# Patient Record
Sex: Male | Born: 1965 | Race: Black or African American | Hispanic: No | Marital: Married | Smoking: Former smoker
Health system: Southern US, Community
[De-identification: ages and names within clinical notes are randomized; demographics above are authoritative.]

## PROBLEM LIST (undated history)

## (undated) DIAGNOSIS — F329 Major depressive disorder, single episode, unspecified: Secondary | ICD-10-CM

## (undated) DIAGNOSIS — F1910 Other psychoactive substance abuse, uncomplicated: Secondary | ICD-10-CM

## (undated) DIAGNOSIS — E559 Vitamin D deficiency, unspecified: Secondary | ICD-10-CM

## (undated) DIAGNOSIS — K219 Gastro-esophageal reflux disease without esophagitis: Secondary | ICD-10-CM

## (undated) DIAGNOSIS — T7840XA Allergy, unspecified, initial encounter: Secondary | ICD-10-CM

## (undated) DIAGNOSIS — F32A Depression, unspecified: Secondary | ICD-10-CM

## (undated) DIAGNOSIS — E7800 Pure hypercholesterolemia, unspecified: Secondary | ICD-10-CM

## (undated) HISTORY — DX: Major depressive disorder, single episode, unspecified: F32.9

## (undated) HISTORY — PX: VASECTOMY: SHX75

## (undated) HISTORY — DX: Depression, unspecified: F32.A

## (undated) HISTORY — DX: Other psychoactive substance abuse, uncomplicated: F19.10

## (undated) HISTORY — DX: Gastro-esophageal reflux disease without esophagitis: K21.9

## (undated) HISTORY — DX: Allergy, unspecified, initial encounter: T78.40XA

## (undated) HISTORY — PX: WRIST DEBRIDEMENT: SHX2671

---

## 1999-09-17 ENCOUNTER — Other Ambulatory Visit (HOSPITAL_COMMUNITY): Admission: RE | Admit: 1999-09-17 | Discharge: 1999-10-01 | Payer: Self-pay | Admitting: Psychiatry

## 2002-04-29 ENCOUNTER — Emergency Department (HOSPITAL_COMMUNITY): Admission: EM | Admit: 2002-04-29 | Discharge: 2002-04-29 | Payer: Self-pay | Admitting: Emergency Medicine

## 2007-11-30 ENCOUNTER — Emergency Department (HOSPITAL_BASED_OUTPATIENT_CLINIC_OR_DEPARTMENT_OTHER): Admission: EM | Admit: 2007-11-30 | Discharge: 2007-11-30 | Payer: Self-pay | Admitting: Emergency Medicine

## 2010-11-26 LAB — POCT CARDIAC MARKERS
CKMB, poc: <1 — ABNORMAL LOW
Myoglobin, poc: 43.4

## 2010-11-26 LAB — DIFFERENTIAL
Basophils Absolute: 0
Eosinophils Relative: 2
Lymphocytes Relative: 44
Monocytes Absolute: 0.4
Monocytes Relative: 5
Neutrophils Relative %: 48

## 2010-11-26 LAB — HEPATIC FUNCTION PANEL
ALT: 16
AST: 22
Albumin: 4.2
Bilirubin, Direct: 0
Indirect Bilirubin: 0.3
Total Bilirubin: 0.3
Total Protein: 7.3

## 2010-11-26 LAB — CBC
HCT: 44.8
MCV: 86.7
WBC: 6.7

## 2010-11-26 LAB — BASIC METABOLIC PANEL
BUN: 8
Calcium: 9.2
GFR calc Af Amer: >60
Glucose, Bld: 111 — ABNORMAL HIGH
Sodium: 141

## 2011-04-17 ENCOUNTER — Emergency Department (HOSPITAL_COMMUNITY)
Admission: EM | Admit: 2011-04-17 | Discharge: 2011-04-17 | Disposition: A | Discharge status: Nursing Home (Includes ICF) | Payer: Self-pay | Source: Home / Self Care | Attending: Emergency Medicine | Admitting: Emergency Medicine

## 2011-04-17 ENCOUNTER — Other Ambulatory Visit: Payer: Self-pay

## 2011-04-17 ENCOUNTER — Emergency Department (HOSPITAL_COMMUNITY): Payer: Self-pay

## 2011-04-17 ENCOUNTER — Encounter (HOSPITAL_COMMUNITY): Payer: Self-pay | Admitting: *Deleted

## 2011-04-17 ENCOUNTER — Emergency Department (HOSPITAL_COMMUNITY)
Admission: EM | Admit: 2011-04-17 | Discharge: 2011-04-18 | Disposition: A | Discharge status: Home / Self Care | Payer: Self-pay | Attending: Emergency Medicine | Admitting: Emergency Medicine

## 2011-04-17 DIAGNOSIS — K219 Gastro-esophageal reflux disease without esophagitis: Secondary | ICD-10-CM | POA: Insufficient documentation

## 2011-04-17 DIAGNOSIS — R079 Chest pain, unspecified: Secondary | ICD-10-CM

## 2011-04-17 DIAGNOSIS — R0789 Other chest pain: Secondary | ICD-10-CM | POA: Insufficient documentation

## 2011-04-17 DIAGNOSIS — Z87891 Personal history of nicotine dependence: Secondary | ICD-10-CM | POA: Insufficient documentation

## 2011-04-17 LAB — CBC
Hemoglobin: 16.3 g/dL (ref 13.0–17.0)
MCH: 30.4 pg (ref 26.0–34.0)
MCHC: 35.9 g/dL (ref 30.0–36.0)
MCV: 84.5 fL (ref 78.0–100.0)
Platelets: 155 10*3/uL (ref 150–400)
RBC: 5.37 MIL/uL (ref 4.22–5.81)
WBC: 5.9 10*3/uL (ref 4.0–10.5)

## 2011-04-17 LAB — GLUCOSE, CAPILLARY: Glucose-Capillary: 91 mg/dL (ref 70–99)

## 2011-04-17 LAB — PRO B NATRIURETIC PEPTIDE: Pro B Natriuretic peptide (BNP): 14.1 pg/mL (ref 0–125)

## 2011-04-17 LAB — MAGNESIUM: Magnesium: 2 mg/dL (ref 1.5–2.5)

## 2011-04-17 LAB — BASIC METABOLIC PANEL: BUN: 8 mg/dL (ref 6–23)

## 2011-04-17 MED ORDER — GI COCKTAIL ~~LOC~~
30.0000 mL | Freq: Once | ORAL | Status: AC
  Administered 2011-04-17: 30 mL via ORAL
  Filled 2011-04-17: qty 30

## 2011-04-17 MED ORDER — ASPIRIN 81 MG PO CHEW
CHEWABLE_TABLET | ORAL | Status: AC
  Filled 2011-04-17: qty 1

## 2011-04-17 MED ORDER — PANTOPRAZOLE SODIUM 40 MG PO TBEC
40.0000 mg | DELAYED_RELEASE_TABLET | Freq: Once | ORAL | Status: AC
  Administered 2011-04-17: 40 mg via ORAL
  Filled 2011-04-17: qty 1

## 2011-04-17 MED ORDER — ASPIRIN 325 MG PO TABS: 325.0000 mg | ORAL_TABLET | ORAL | Status: DC

## 2011-04-17 MED ORDER — NITROGLYCERIN 0.4 MG SL SUBL
0.4000 mg | SUBLINGUAL_TABLET | SUBLINGUAL | Status: DC | PRN
  Filled 2011-04-17 (×2): qty 25

## 2011-04-17 MED ORDER — PANTOPRAZOLE SODIUM 40 MG PO TBEC
40.0000 mg | DELAYED_RELEASE_TABLET | Freq: Once | ORAL | Status: AC
  Administered 2011-04-18: 40 mg via ORAL

## 2011-04-17 MED ORDER — ASPIRIN 81 MG PO CHEW
81.0000 mg | CHEWABLE_TABLET | Freq: Once | ORAL | Status: AC
  Administered 2011-04-17: 81 mg via ORAL

## 2011-04-17 MED ORDER — ASPIRIN EC 325 MG PO TBEC
DELAYED_RELEASE_TABLET | ORAL | Status: AC
  Administered 2011-04-17: 325 mg
  Filled 2011-04-17: qty 1

## 2011-04-17 NOTE — ED Provider Notes (Signed)
History     CSN: [id number]  Arrival date & time 04/17/11  1908   First MD Initiated Contact with Patient 04/17/11 2103      Chief Complaint  Patient presents with  . Chest Pain    (Consider location/radiation/quality/duration/timing/severity/associated sxs/prior treatment) HPI  Daniel Lloyd is a 46 y.o. male who has been having intermittent pain for 60 minutes 4-5 times a day at various times a day without known provocation. The pain is a tight feeling in his left lower chest. He does not have associated nausea, vomiting, diarrhea, diaphoresis, or back pain. His legs occasionally feel like they are weak as if he had been running on a treadmill. He has tried gas pills, but no other medications. He had a similar problem to this about a year ago, and was treated with Prilosec for 2 weeks. He has no coronary risk factors.        History reviewed. No pertinent past medical history.  Past Surgical History  Procedure Date  . Wrist debridement     Family History  Problem Relation Age of Onset  . Hypertension Mother   . Cancer Other   . Diabetes Other     History  Substance Use Topics  . Smoking status: Former Games developer  . Smokeless tobacco: Not on file  . Alcohol Use: No      Review of Systems  All other systems reviewed and are negative.    Allergies  Review of patient's allergies indicates no known allergies.  Home Medications  No current outpatient prescriptions on file.  BP 132/92  Pulse 61  Temp(Src) 98.4 F (36.9 C) (Oral)  Resp 16  Ht 5\' 9"  (1.753 m)  Wt 220 lb (99.791 kg)  BMI 32.49 kg/m2  SpO2 100%  Physical Exam  Nursing note and vitals reviewed. Constitutional: He is oriented to person, place, and time. He appears well-developed and well-nourished. No distress.  HENT:  Head: Normocephalic and atraumatic.  Right Ear: External ear normal.  Left Ear: External ear normal.  Eyes: Conjunctivae and EOM are normal. Pupils are equal, round, and  reactive to light.  Neck: Normal range of motion and phonation normal. Neck supple.  Cardiovascular: Normal rate, regular rhythm, normal heart sounds and intact distal pulses.   Pulmonary/Chest: Effort normal and breath sounds normal. He exhibits no bony tenderness.       Mild left lower chest wall tenderness  Abdominal: Soft. Normal appearance. There is no tenderness.  Musculoskeletal: Normal range of motion.  Neurological: He is alert and oriented to person, place, and time. He has normal strength. No cranial nerve deficit or sensory deficit. He exhibits normal muscle tone. Coordination normal.  Skin: Skin is warm, dry and intact.  Psychiatric: He has a normal mood and affect. His behavior is normal. Judgment and thought content normal.    ED Course  Procedures (including critical care time)  Date: 04/17/2011  Rate: 58  Rhythm: normal sinus rhythm  QRS Axis: normal  Intervals: normal  ST/T Wave abnormalities: nonspecific ST changes  Conduction Disutrbances:none  Narrative Interpretation:   Old EKG Reviewed: unchanged    Labs Reviewed  BASIC METABOLIC PANEL - Abnormal; Notable for the following:    GFR calc non Af Amer 66 (*)    GFR calc Af Amer 76 (*)    All other components within normal limits  CBC  PRO B NATRIURETIC PEPTIDE  TROPONIN I  MAGNESIUM   Dg Chest 2 View  04/17/2011  *RADIOLOGY REPORT*  Clinical Data: Chest pain and chest tightness  CHEST - 2 VIEW  Comparison: 11/30/2007  Findings: Heart is normal in size.  Linear opacities at both lung bases likely represent bibasilar atelectasis.  Bronchitic changes. No pneumothorax.  No pleural effusion.  IMPRESSION: Bibasilar atelectasis.  Original Report Authenticated By: Donavan Burnet, M.D.   Emergency department treatment: Tonics and GI cocktail  1. GERD (gastroesophageal reflux disease)       MDM  Nonspecific chest tightness. Evaluation negative for cardiac disease. Doubt ACS, pneumonia, PE. He may have GERD.  Patient stable for discharge with symptomatic treatment.        Flint Melter, MD 04/18/11 0000

## 2011-04-17 NOTE — ED Notes (Signed)
Pt has had "bloating" and CP for over a week which has been associated with sob.  Pt was seen at [facility] and sent here for further eval

## 2011-04-17 NOTE — ED Notes (Signed)
Report called to Selena Batten RN triage nurse [place] and pt transferred to [facility] ED via [facility] shuttle.  Pt states still no pain, just same chest tightness and feeling bloated

## 2011-04-17 NOTE — ED Notes (Signed)
Monitor on pt. Shows NSR rate 67

## 2011-04-17 NOTE — ED Notes (Signed)
C/o chest tightness, radiating to both armpits mostly left and to mostly left side of neck.  Also c/o feeling bloated and feeling like when he swallows it doesn't do down.  Also c/o tightness "around to my back."  States this has been going on "for years"  Also states he now has blurry vision and fatigue w/ this.  States if he stands up for a long time it's "like I have been working out for a long time"  States he has been to [facility] for same c/o in the past

## 2011-04-17 NOTE — ED Provider Notes (Signed)
History     CSN: [id number]  Arrival date & time 04/17/11  1710   First MD Initiated Contact with Patient 04/17/11 1816      Chief Complaint  Patient presents with  . Chest Pain    (Consider location/radiation/quality/duration/timing/severity/associated sxs/prior treatment) HPI Comments: Patient presents to urgent care tonight complaining of ongoing chest tightness and discomfort radiating to both of his armpits and neck. With the same importance he has been feeling that his abdomen feels bloated and some discomfort on his back as well has been: On for months..  Pain has not changed in character or intensity for months, decided to come in as he felt more tired than usual and also noticed that his vision is blurred in both eyes since this morning.  Patient describes that he has not seen a doctor since his last visit to the emergency department. And also describes that he doesn't think he has a heart problem feels more like a stomach problem.   History reviewed. No pertinent past medical history.  Past Surgical History  Procedure Date  . Wrist debridement     Family History  Problem Relation Age of Onset  . Hypertension Mother   . Cancer Other   . Diabetes Other     History  Substance Use Topics  . Smoking status: Former Games developer  . Smokeless tobacco: Not on file  . Alcohol Use: No      Review of Systems  Constitutional: Positive for activity change, appetite change and fatigue. Negative for fever, chills and diaphoresis.  Respiratory: Negative for cough, chest tightness and shortness of breath.   Cardiovascular: Positive for chest pain. Negative for palpitations and leg swelling.  Gastrointestinal: Negative for nausea, vomiting, abdominal pain, diarrhea and anal bleeding.  Skin: Negative for color change, pallor and rash.  Neurological: Negative for tremors, syncope, speech difficulty, weakness, numbness and headaches.    Allergies  Review of patient's allergies  indicates no known allergies.  Home Medications  No current outpatient prescriptions on file.  BP 128/85  Pulse 71  Temp(Src) 98 F (36.7 C) (Oral)  Resp 17  SpO2 100%  Physical Exam  Nursing note and vitals reviewed. Constitutional: He appears well-developed and well-nourished. No distress.  HENT:  Head: Normocephalic.  Mouth/Throat: No oropharyngeal exudate.  Eyes: Conjunctivae are normal. No scleral icterus.  Neck: Neck supple. No JVD present.  Cardiovascular: Normal rate, regular rhythm and normal heart sounds.   Pulmonary/Chest: Effort normal.  Abdominal: Soft. He exhibits no shifting dullness. There is no hepatosplenomegaly. There is no tenderness. There is negative Murphy's sign.  Lymphadenopathy:    He has no cervical adenopathy.  Neurological: He is alert.  Skin: Skin is warm.    ED Course  Procedures (including critical care time)  Labs Reviewed - No data to display No results found.   1. Chest pain       MDM  Patient presents to urgent care with [facility] symptomatology that includes chest tightness and discomfort with abdominal discomfort that has been going on for several months. Today he noted that he has been feeling tired and blurry vision. His physical exam was somewhat unremarkable and has no neurological deficits is normotensive looks comfortable his EKG looks comparatively the same as previous one done 2011. I will transfer patient to the emergency department to have a more comprehensive evaluation including cardiac markers. My impression at this point as the patient is not experiencing  an acute coronary syndrome.  Jimmie Molly, MD 04/17/11 [phone]

## 2011-04-17 NOTE — Discharge Instructions (Signed)
Use of omeprazole 20 mg twice a day for 2 weeks then once a day for 2 weeks.You can supplement this treatment with Maalox or Mylanta before meals and at bed time. Use a resource guide to find a primary care Dr. to see for a checkup in 2-3 weeks. Return here if needed for problems.  RESOURCE GUIDE  Dental Problems  Patients with Medicaid: [facility] [phone] [address]                                           [phone] [address] Phone:  [phone]                                                  Phone:  [phone]  If unable to pay or uninsured, contact:  [facility] or [facility]. to become qualified for the adult dental clinic.  Chronic Pain Problems Contact [facility]  [phone] Patients need to be referred by their primary care doctor.  Insufficient Money for Medicine Contact [employer]:  call "211" or [facility] 514-121-0547.  No Primary Care Doctor Call [facility]  [phone] Other agencies that provide inexpensive medical care    [facility]  [phone]    [facility]  [phone]    [facility]  [phone]    [facility]  [phone]    [facility]  [phone]    [facility]  [phone]  Psychological Services [facility] Behavioral Health  [phone] [facility] Services  [phone] [facility] Mental Health   [phone] (emergency services [phone])  Substance Abuse Resources [facility]  [phone] [facility] [phone] [facility] [place] 6261780195 Floydene Flock [phone] Residential & Outpatient Substance Abuse Program  [phone]  Abuse/Neglect [facility] Child Abuse Hotline 805-140-6724 [facility] Child Abuse Hotline 989-544-2055 (After Hours)  Emergency Shelter [facility] 2531929727  Maternity Homes [facility] [place] [facility] [phone] [facility] [phone]  MRSA Hotline #:   563 114 4935    [facility] Resources  [facility]     [employer]                          [facility] [address] [place]                       [address]      [address]                                                  Cristobal Goldmann Phone:  [phone]  Phone:  [phone]                 Phone:  [phone]  [facility] Mental Health Phone:  [phone]  [facility] Child Abuse Hotline 918-052-1692 [phone] (After Hours) Diet for GERD or PUD Nutrition therapy can help ease the discomfort of gastroesophageal reflux disease (GERD) and peptic ulcer disease (PUD).  HOME CARE INSTRUCTIONS   Eat your meals slowly, in a relaxed setting.   Eat 5 to 6 small meals per day.   If a food causes distress, stop eating it for a period of time.  FOODS TO AVOID  Coffee, regular or decaffeinated.   Cola beverages, regular or low calorie.   Tea, regular or decaffeinated.   Pepper.   Cocoa.   High fat foods, including meats.   Butter, margarine, hydrogenated oil (trans fats).   Peppermint or spearmint (if you have GERD).   Fruits and vegetables if not tolerated.   Alcohol.   Nicotine (smoking or chewing). This is one of the most potent stimulants to acid production in the gastrointestinal tract.   Any food that seems to aggravate your condition.  If you have questions regarding your diet, ask your caregiver or a registered dietitian. TIPS  Lying flat may make symptoms worse. Keep the head of your bed raised 6 to 9 inches (15 to 23 cm) by using a foam wedge or blocks under the legs of the bed.   Do not lay down until 3 hours after eating a meal.   Daily physical activity may help reduce symptoms.  MAKE SURE YOU:   Understand these instructions.   Will  watch your condition.   Will get help right away if you are not doing well or get worse.  Document Released: 02/10/2005 Document Revised: 10/23/2010 Document Reviewed: 06/26/2008 [facility] Patient Information 2012 [place], [place].Gastroesophageal Reflux Disease, Adult Gastroesophageal reflux disease (GERD) happens when acid from your stomach flows up into the esophagus. When acid comes in contact with the esophagus, the acid causes soreness (inflammation) in the esophagus. Over time, GERD may create small holes (ulcers) in the lining of the esophagus. CAUSES   Increased body weight. This puts pressure on the stomach, making acid rise from the stomach into the esophagus.   Smoking. This increases acid production in the stomach.   Drinking alcohol. This causes decreased pressure in the lower esophageal sphincter (valve or ring of muscle between the esophagus and stomach), allowing acid from the stomach into the esophagus.   Late evening meals and a full stomach. This increases pressure and acid production in the stomach.   A malformed lower esophageal sphincter.  Sometimes, no cause is found. SYMPTOMS   Burning pain in the lower part of the mid-chest behind the breastbone and in the mid-stomach area. This may occur twice a week or more often.   Trouble swallowing.   Sore throat.   Dry cough.   Asthma-like symptoms including chest tightness, shortness of breath, or wheezing.  DIAGNOSIS  Your caregiver may be able to diagnose GERD based on your symptoms. In some cases, X-rays and other tests may be done to check for complications or to check the condition of your stomach and esophagus. TREATMENT  Your caregiver may recommend over-the-counter or prescription medicines to help decrease acid production. Ask your caregiver before starting or adding any new medicines.  HOME CARE INSTRUCTIONS   Change the factors that you can control. Ask your caregiver for guidance concerning weight loss,  quitting  smoking, and alcohol consumption.   Avoid foods and drinks that make your symptoms worse, such as:   Caffeine or alcoholic drinks.   Chocolate.   Peppermint or mint flavorings.   Garlic and onions.   Spicy foods.   Citrus fruits, such as oranges, lemons, or limes.   Tomato-based foods such as sauce, chili, salsa, and pizza.   Fried and fatty foods.   Avoid lying down for the 3 hours prior to your bedtime or prior to taking a nap.   Eat small, frequent meals instead of large meals.   Wear loose-fitting clothing. Do not wear anything tight around your waist that causes pressure on your stomach.   Raise the head of your bed 6 to 8 inches with wood blocks to help you sleep. Extra pillows will not help.   Only take over-the-counter or prescription medicines for pain, discomfort, or fever as directed by your caregiver.   Do not take aspirin, ibuprofen, or other nonsteroidal anti-inflammatory drugs (NSAIDs).  SEEK IMMEDIATE MEDICAL CARE IF:   You have pain in your arms, neck, jaw, teeth, or back.   Your pain increases or changes in intensity or duration.   You develop nausea, vomiting, or sweating (diaphoresis).   You develop shortness of breath, or you faint.   Your vomit is green, yellow, black, or looks like coffee grounds or blood.   Your stool is red, bloody, or black.  These symptoms could be signs of other problems, such as heart disease, gastric bleeding, or esophageal bleeding. MAKE SURE YOU:   Understand these instructions.   Will watch your condition.   Will get help right away if you are not doing well or get worse.  Document Released: 11/20/2004 Document Revised: 10/23/2010 Document Reviewed: 08/30/2010 [facility] Patient Information 2012 [place], [place].

## 2011-04-18 NOTE — ED Notes (Signed)
Pt reports pain moreso in the epigastric region.  He currently denies N/V/D, no dyspnea or diaphoresis noted.

## 2011-04-18 NOTE — ED Notes (Signed)
Patient is AOx4 and comfortable with his discharge instructions. 

## 2012-07-04 ENCOUNTER — Emergency Department (INDEPENDENT_AMBULATORY_CARE_PROVIDER_SITE_OTHER)
Admission: EM | Admit: 2012-07-04 | Discharge: 2012-07-04 | Disposition: A | Discharge status: Home / Self Care | Payer: BC Managed Care – PPO | Source: Home / Self Care | Attending: Family Medicine | Admitting: Family Medicine

## 2012-07-04 ENCOUNTER — Encounter (HOSPITAL_COMMUNITY): Payer: Self-pay

## 2012-07-04 DIAGNOSIS — R202 Paresthesia of skin: Secondary | ICD-10-CM

## 2012-07-04 DIAGNOSIS — R1013 Epigastric pain: Secondary | ICD-10-CM

## 2012-07-04 DIAGNOSIS — K3189 Other diseases of stomach and duodenum: Secondary | ICD-10-CM

## 2012-07-04 DIAGNOSIS — R209 Unspecified disturbances of skin sensation: Secondary | ICD-10-CM

## 2012-07-04 LAB — POCT I-STAT, CHEM 8
BUN: 11 mg/dL (ref 6–23)
Calcium, Ion: 1.22 mmol/L (ref 1.12–1.23)
Hemoglobin: 16.3 g/dL (ref 13.0–17.0)
Sodium: 144 mEq/L (ref 135–145)
TCO2: 25 mmol/L (ref 0–100)

## 2012-07-04 MED ORDER — OMEPRAZOLE 20 MG PO CPDR: 20.0000 mg | DELAYED_RELEASE_CAPSULE | Freq: Every day | ORAL | Status: DC

## 2012-07-04 NOTE — ED Provider Notes (Signed)
History     CSN: [id number]  Arrival date & time 07/04/12  1101   First MD Initiated Contact with Patient 07/04/12 1115      Chief Complaint  Patient presents with  . Bloated    (Consider location/radiation/quality/duration/timing/severity/associated sxs/prior treatment) HPI Comments: 47 year old male former smoker with history of intermittent abdominal discomfort, or acid reflux and sensation of bloatedness for years. Today comes complaining of mild abdominal discomfort but mostly complaining of tingling sensations in his hands and legs for over 2 weeks. Denies extremity weakness. Denies headache has had some dizziness associated symptoms denies nausea vomiting or diarrhea. Denies constipation. Denies melena. Denies red blood stools. Denies unintentional weight loss. Denies chest pain or shortness of breath. Denies polyuria polydipsia or polyphagia. Patient states he comes today "to uncover the mysteries of his body changes in the last 2 years" patient report has not had a primary care provider in the past and has been seen here in the emergency department with normal cardiac workup last time February 2013. He now has medical insurance. Denies anxiety or depression. Reports sleeping well at night.   History reviewed. No pertinent past medical history.  Past Surgical History  Procedure Laterality Date  . Wrist debridement      Family History  Problem Relation Age of Onset  . Hypertension Mother   . Cancer Other   . Diabetes Other     History  Substance Use Topics  . Smoking status: Former Games developer  . Smokeless tobacco: Not on file  . Alcohol Use: No      Review of Systems  Constitutional: Negative for fever, chills, diaphoresis and fatigue.  HENT: Negative for congestion and sore throat.   Respiratory: Negative for cough and shortness of breath.   Gastrointestinal: Positive for abdominal pain. Negative for nausea, vomiting and diarrhea.  Endocrine: Negative for cold  intolerance, heat intolerance, polydipsia, polyphagia and polyuria.  Skin: Negative for rash.  Neurological: Negative for dizziness, tremors, seizures, syncope, speech difficulty, weakness, numbness and headaches.  All other systems reviewed and are negative.    Allergies  Review of patient's allergies indicates no known allergies.  Home Medications   Current Outpatient Rx  Name  Route  Sig  Dispense  Refill  . omeprazole (PRILOSEC) 20 MG capsule   Oral   Take 1 capsule (20 mg total) by mouth daily.   30 capsule   0     BP 132/92  Pulse 64  Temp(Src) 97.8 F (36.6 C) (Oral)  Resp 12  SpO2 97%  Physical Exam  Nursing note and vitals reviewed. Constitutional: He is oriented to person, place, and time. He appears well-developed. No distress.  Appears comfortable talkative.   HENT:  Head: Normocephalic and atraumatic.  Mouth/Throat: Oropharynx is clear and moist. No oropharyngeal exudate.  Eyes: Conjunctivae and EOM are normal. Pupils are equal, round, and reactive to light. No scleral icterus.  Neck: Normal range of motion. Neck supple. No JVD present. No thyromegaly present.  Cardiovascular: Normal rate, regular rhythm and normal heart sounds.   Pulmonary/Chest: Effort normal and breath sounds normal. No respiratory distress. He has no wheezes. He has no rales. He exhibits no tenderness.  Abdominal: Soft. Bowel sounds are normal. He exhibits no distension and no mass. There is no tenderness. There is no rebound and no guarding.  Lymphadenopathy:    He has no cervical adenopathy.  Neurological: He is alert and oriented to person, place, and time. He has normal strength and normal reflexes. No  cranial nerve deficit or sensory deficit.  Skin: No rash noted. He is not diaphoretic.    ED Course  Procedures (including critical care time)  Labs Reviewed  POCT I-STAT, CHEM 8 - Abnormal; Notable for the following:    Creatinine, Ser 1.50 (*)    All other components within  normal limits   No results found.   1. Dyspepsia   2. Paresthesia       MDM  Clinically well. Reassuring physical exam. Point-of-care electrolytes, hgb, glucose normal, creatinine slight increase. Borderline HTN here. Prescribe Prilosec. Impress anxiety component contributing although patient denies anxiety or depression symptoms. Patient was given information about primary care provider office is in our area. He is agreeable to call and find a primary care clinic to establish as a new patient to monitor his symptoms, blood pressure and kidney function. Supportive care and red flags that should prompt his return to medical attention discussed with patient and provided in writing.        Sharin Grave, MD 07/05/12 1045

## 2012-07-04 NOTE — ED Notes (Signed)
C/o several issues for several years. Was reportedly told at one time that he is "pre-diabetic" . Has bee experiencing tingling in his extremities, bloating, epigastric discomfort which wakes him at night, nausea (no vomiting) dizziness, blurred vision. Denies pain at present, denies blood in stool

## 2012-07-05 ENCOUNTER — Ambulatory Visit: Payer: BC Managed Care – PPO | Admitting: Family Medicine

## 2012-07-05 VITALS — BP 128/90 | HR 64 | Temp 98.1°F | Resp 18 | Ht 69.0 in | Wt 228.0 lb

## 2012-07-05 DIAGNOSIS — N289 Disorder of kidney and ureter, unspecified: Secondary | ICD-10-CM

## 2012-07-05 DIAGNOSIS — R141 Gas pain: Secondary | ICD-10-CM

## 2012-07-05 DIAGNOSIS — R109 Unspecified abdominal pain: Secondary | ICD-10-CM

## 2012-07-05 DIAGNOSIS — R142 Eructation: Secondary | ICD-10-CM

## 2012-07-05 DIAGNOSIS — K219 Gastro-esophageal reflux disease without esophagitis: Secondary | ICD-10-CM

## 2012-07-05 DIAGNOSIS — R140 Abdominal distension (gaseous): Secondary | ICD-10-CM

## 2012-07-05 DIAGNOSIS — R1011 Right upper quadrant pain: Secondary | ICD-10-CM

## 2012-07-05 LAB — POCT CBC
HCT, POC: 47.3 % (ref 43.5–53.7)
Hemoglobin: 15.4 g/dL (ref 14.1–18.1)
Lymph, poc: 2.5 (ref 0.6–3.4)
MCH, POC: 29.8 pg (ref 27–31.2)
MPV: 9.9 fL (ref 0–99.8)
POC Granulocyte: 1.6 — AB (ref 2–6.9)
POC MID %: 8.1 %M (ref 0–12)
RBC: 5.17 M/uL (ref 4.69–6.13)
WBC: 4.5 10*3/uL — AB (ref 4.6–10.2)

## 2012-07-05 LAB — COMPREHENSIVE METABOLIC PANEL
ALT: 14 U/L (ref 0–53)
Albumin: 4.2 g/dL (ref 3.5–5.2)
CO2: 27 mEq/L (ref 19–32)
Calcium: 9.1 mg/dL (ref 8.4–10.5)
Chloride: 107 mEq/L (ref 96–112)
Glucose, Bld: 86 mg/dL (ref 70–99)
Sodium: 141 mEq/L (ref 135–145)
Total Protein: 7.1 g/dL (ref 6.0–8.3)

## 2012-07-05 MED ORDER — RANITIDINE HCL 300 MG PO TABS: 150.0000 mg | ORAL_TABLET | Freq: Two times a day (BID) | ORAL | Status: DC

## 2012-07-05 MED ORDER — METOCLOPRAMIDE HCL 5 MG PO TABS: ORAL_TABLET | ORAL | Status: DC

## 2012-07-05 NOTE — Patient Instructions (Addendum)
For your mild kidney insufficiency, drink more water every day. We will repeat the labs in a couple of months.  You'll be scheduled for a gallbladder ultrasound. Someone should contact you about this in the next couple of days. If you do not hear from our office on that, call back before the end of the week and speak to referral.  Take the Reglan (metoclopramide) 1 pill 3 times daily only when needed for bloating. I do not want you to be taking this on a regular basis.   Take the ranitidine 300 mg one daily at bedtime for your stomach to reduce stomach acid production.  Purchased some over-the-counter MiraLax and take one dose daily until your stools are loose, then cut back to using only if you have not had a bowel movement that day. I would like you to be having a bowel movement or two approximately every day  If not doing better in a couple weeks return for recheck  If we are not arriving in any answers we will make a referral for a scoping examination for you.

## 2012-07-05 NOTE — Progress Notes (Signed)
Subjective

## 2012-07-06 LAB — H. PYLORI ANTIBODY, IGG: H Pylori IgG: 1.03 {ISR} — ABNORMAL HIGH

## 2012-07-08 ENCOUNTER — Ambulatory Visit
Admission: RE | Admit: 2012-07-08 | Discharge: 2012-07-08 | Disposition: A | Discharge status: Home / Self Care | Payer: BC Managed Care – PPO | Source: Ambulatory Visit | Attending: Family Medicine | Admitting: Family Medicine

## 2012-07-08 DIAGNOSIS — R109 Unspecified abdominal pain: Secondary | ICD-10-CM

## 2012-11-27 ENCOUNTER — Encounter (HOSPITAL_BASED_OUTPATIENT_CLINIC_OR_DEPARTMENT_OTHER): Payer: Self-pay | Admitting: Emergency Medicine

## 2012-11-27 ENCOUNTER — Emergency Department (HOSPITAL_BASED_OUTPATIENT_CLINIC_OR_DEPARTMENT_OTHER)
Admission: EM | Admit: 2012-11-27 | Discharge: 2012-11-27 | Disposition: A | Discharge status: Home / Self Care | Payer: BC Managed Care – PPO | Attending: Emergency Medicine | Admitting: Emergency Medicine

## 2012-11-27 DIAGNOSIS — K089 Disorder of teeth and supporting structures, unspecified: Secondary | ICD-10-CM | POA: Insufficient documentation

## 2012-11-27 DIAGNOSIS — Z8659 Personal history of other mental and behavioral disorders: Secondary | ICD-10-CM | POA: Insufficient documentation

## 2012-11-27 DIAGNOSIS — Z79899 Other long term (current) drug therapy: Secondary | ICD-10-CM | POA: Insufficient documentation

## 2012-11-27 DIAGNOSIS — Z87891 Personal history of nicotine dependence: Secondary | ICD-10-CM | POA: Insufficient documentation

## 2012-11-27 DIAGNOSIS — K0889 Other specified disorders of teeth and supporting structures: Secondary | ICD-10-CM

## 2012-11-27 MED ORDER — OXYCODONE-ACETAMINOPHEN 5-325 MG PO TABS
1.0000 | ORAL_TABLET | Freq: Once | ORAL | Status: AC
  Administered 2012-11-27: 1 via ORAL
  Filled 2012-11-27: qty 1

## 2012-11-27 MED ORDER — PENICILLIN V POTASSIUM 500 MG PO TABS: 500.0000 mg | ORAL_TABLET | Freq: Three times a day (TID) | ORAL | Status: DC

## 2012-11-27 MED ORDER — OXYCODONE-ACETAMINOPHEN 5-325 MG PO TABS: 1.0000 | ORAL_TABLET | Freq: Four times a day (QID) | ORAL | Status: DC | PRN

## 2012-11-27 NOTE — ED Provider Notes (Signed)
CSN: [id number]     Arrival date & time 11/27/12  0543 History   First MD Initiated Contact with Patient 11/27/12 [phone]     Chief Complaint  Patient presents with  . Dental Pain   (Consider location/radiation/quality/duration/timing/severity/associated sxs/prior Treatment) HPI Pt presents with c/o pain in left lower molar.  Pt states pain has been intermittent over the past few weeks- he has been feeling small pieces of the filling coming out.  He states he has been taking aspirin which was helping somewhat until pain became worse tonight.  No fever/chills.  No difficulty swallowing or breathing.  No vomiting.  Pain is worse with chewing and cold liquids.  Pain radiates to left ear.  There are no other associated systemic symptoms, there are no other alleviating or modifying factors.   Past Medical History  Diagnosis Date  . Depression   . Substance abuse    Past Surgical History  Procedure Laterality Date  . Wrist debridement     Family History  Problem Relation Age of Onset  . Hypertension Mother   . Cancer Other   . Diabetes Other    History  Substance Use Topics  . Smoking status: Former Games developer  . Smokeless tobacco: Not on file  . Alcohol Use: No    Review of Systems ROS reviewed and all otherwise negative except for mentioned in HPI  Allergies  Review of patient's allergies indicates no known allergies.  Home Medications   Current Outpatient Rx  Name  Route  Sig  Dispense  Refill  . metoCLOPramide (REGLAN) 5 MG tablet      Take one pill 3 times daily only if needed for bloating/pan   60 tablet   0   . omeprazole (PRILOSEC) 20 MG capsule   Oral   Take 1 capsule (20 mg total) by mouth daily.   30 capsule   0   . oxyCODONE-acetaminophen (PERCOCET/ROXICET) 5-325 MG per tablet   Oral   Take 1-2 tablets by mouth every 6 (six) hours as needed for pain.   15 tablet   0   . penicillin v potassium (VEETID) 500 MG tablet   Oral   Take 1 tablet (500 mg total) by  mouth 3 (three) times daily.   30 tablet   0   . ranitidine (ZANTAC) 300 MG tablet   Oral   Take 0.5 tablets (150 mg total) by mouth 2 (two) times daily.   30 tablet   3    BP 148/99  Pulse 73  Temp(Src) 98.5 F (36.9 C) (Oral)  Resp 18  Ht 5\' 9"  (1.753 m)  Wt 230 lb (104.327 kg)  BMI 33.95 kg/m2  SpO2 100% Vitals reviewed Physical Exam Physical Examination: General appearance - alert, well appearing, and in no distress Mental status - alert, oriented to person, place, and time Eyes - no scleral icterus, no conjunctival injection Ears - bilateral TM's and external ear canals normal Mouth - mucous membranes moist, pharynx normal without lesions, multiple fillings, ttp over left lower molar, no evidence of periapical abscess, no gingival swelling or erythema, no swelling under the tongue Neck - supple, no significant adenopathy Extremities - peripheral pulses normal, no pedal edema, no clubbing or cyanosis Skin - normal coloration and turgor, no rashes  ED Course  Procedures (including critical care time) Labs Review Labs Reviewed - No data to display Imaging Review No results found.  MDM   1. Pain, dental    Pt presenting with pain in  left lower molar- no frank abscess.  Pt given pain control in the ED, rx for pain meds and antibiotics.  Given information for dental followup.  Pt counseled that he will need to see dentist for definitve treatment.  Discharged with strict return precautions.  Pt agreeable with plan.    Ethelda Chick, MD 11/27/12 [phone]

## 2012-11-27 NOTE — ED Notes (Addendum)
Pt c/o tooth pain on left lower molar.

## 2012-12-09 ENCOUNTER — Emergency Department (HOSPITAL_BASED_OUTPATIENT_CLINIC_OR_DEPARTMENT_OTHER)
Admission: EM | Admit: 2012-12-09 | Discharge: 2012-12-09 | Disposition: A | Discharge status: Home / Self Care | Payer: BC Managed Care – PPO | Attending: Emergency Medicine | Admitting: Emergency Medicine

## 2012-12-09 ENCOUNTER — Encounter (HOSPITAL_BASED_OUTPATIENT_CLINIC_OR_DEPARTMENT_OTHER): Payer: Self-pay | Admitting: Emergency Medicine

## 2012-12-09 DIAGNOSIS — Z8659 Personal history of other mental and behavioral disorders: Secondary | ICD-10-CM | POA: Insufficient documentation

## 2012-12-09 DIAGNOSIS — K0889 Other specified disorders of teeth and supporting structures: Secondary | ICD-10-CM

## 2012-12-09 DIAGNOSIS — Z87891 Personal history of nicotine dependence: Secondary | ICD-10-CM | POA: Insufficient documentation

## 2012-12-09 DIAGNOSIS — K089 Disorder of teeth and supporting structures, unspecified: Secondary | ICD-10-CM | POA: Insufficient documentation

## 2012-12-09 DIAGNOSIS — Z79899 Other long term (current) drug therapy: Secondary | ICD-10-CM | POA: Insufficient documentation

## 2012-12-09 MED ORDER — CLINDAMYCIN HCL 300 MG PO CAPS: 300.0000 mg | ORAL_CAPSULE | Freq: Four times a day (QID) | ORAL | Status: DC

## 2012-12-09 MED ORDER — HYDROCODONE-ACETAMINOPHEN 5-325 MG PO TABS: 2.0000 | ORAL_TABLET | Freq: Four times a day (QID) | ORAL | Status: DC | PRN

## 2012-12-09 NOTE — ED Notes (Signed)
MD at bedside. 

## 2012-12-09 NOTE — ED Provider Notes (Signed)
CSN: [id number]     Arrival date & time 12/09/12  0158 History   First MD Initiated Contact with Patient 12/09/12 0216     Chief Complaint  Patient presents with  . Dental Pain   (Consider location/radiation/quality/duration/timing/severity/associated sxs/prior Treatment) HPI Had dental pain few weeks ago better with antibiotics from ED, couldn't get into dentist, pain returned few days ago localized #20 (tender), no trauma no fever no headache no stridor no drooling no neck swelling no neck pain no chest pain no shortness breath. Past Medical History  Diagnosis Date  . Depression   . Substance abuse    Past Surgical History  Procedure Laterality Date  . Wrist debridement     Family History  Problem Relation Age of Onset  . Hypertension Mother   . Cancer Other   . Diabetes Other    History  Substance Use Topics  . Smoking status: Former Games developer  . Smokeless tobacco: Not on file  . Alcohol Use: No    Review of Systems 10 Systems reviewed and are negative for acute change except as noted in the HPI. Allergies  Review of patient's allergies indicates no known allergies.  Home Medications   Current Outpatient Rx  Name  Route  Sig  Dispense  Refill  . clindamycin (CLEOCIN) 300 MG capsule   Oral   Take 1 capsule (300 mg total) by mouth 4 (four) times daily. X 7 days   28 capsule   0   . HYDROcodone-acetaminophen (NORCO) 5-325 MG per tablet   Oral   Take 2 tablets by mouth every 6 (six) hours as needed for pain.   10 tablet   0   . metoCLOPramide (REGLAN) 5 MG tablet      Take one pill 3 times daily only if needed for bloating/pan   60 tablet   0   . omeprazole (PRILOSEC) 20 MG capsule   Oral   Take 1 capsule (20 mg total) by mouth daily.   30 capsule   0   . oxyCODONE-acetaminophen (PERCOCET/ROXICET) 5-325 MG per tablet   Oral   Take 1-2 tablets by mouth every 6 (six) hours as needed for pain.   15 tablet   0   . penicillin v potassium (VEETID) 500 MG  tablet   Oral   Take 1 tablet (500 mg total) by mouth 3 (three) times daily.   30 tablet   0   . ranitidine (ZANTAC) 300 MG tablet   Oral   Take 0.5 tablets (150 mg total) by mouth 2 (two) times daily.   30 tablet   3    BP 138/90  Pulse 60  Temp(Src) 98.5 F (36.9 C) (Oral)  Resp 18  Ht 5\' 9"  (1.753 m)  Wt 234 lb (106.142 kg)  BMI 34.54 kg/m2  SpO2 99% Physical Exam  Nursing note and vitals reviewed. Constitutional:  Awake, alert, nontoxic appearance.  HENT:  Head: Atraumatic.  Mouth/Throat: Oropharynx is clear and moist.  Multiple dental fillings, localized percussion tenderness to tooth #20 with localized gingival tenderness without swelling or purulent drainage, no stridor no trismus no drooling no airway compromise  Eyes: Right eye exhibits no discharge. Left eye exhibits no discharge.  Neck: Neck supple.  Cardiovascular: Normal rate and regular rhythm.   No murmur heard. Pulmonary/Chest: Effort normal and breath sounds normal. No respiratory distress. He has no wheezes. He has no rales. He exhibits no tenderness.  Abdominal: Soft. Bowel sounds are normal. He exhibits no distension  and no mass. There is no tenderness. There is no rebound and no guarding.  Musculoskeletal: He exhibits no tenderness.  Baseline ROM, no obvious new focal weakness.  Neurological: He is alert.  Mental status and motor strength appears baseline for patient and situation.  Skin: No rash noted.  Psychiatric: He has a normal mood and affect.    ED Course  Procedures (including critical care time) Labs Review Labs Reviewed - No data to display Imaging Review No results found.  EKG Interpretation   None       MDM   1. Pain, dental    I doubt any other EMC precluding discharge at this time including, but not necessarily limited to the following: Deep space neck infection.    Hurman Horn, MD 12/09/12 [phone]

## 2012-12-09 NOTE — ED Notes (Signed)
Patient reports lower left tooth pain. Has been seen here for the same, taking meds as prescribed, still having pain.

## 2014-07-17 ENCOUNTER — Emergency Department (HOSPITAL_COMMUNITY)
Admission: EM | Admit: 2014-07-17 | Discharge: 2014-07-17 | Disposition: A | Discharge status: Home / Self Care | Payer: No Typology Code available for payment source | Source: Home / Self Care | Attending: Family Medicine | Admitting: Family Medicine

## 2014-07-17 ENCOUNTER — Encounter (HOSPITAL_COMMUNITY): Payer: Self-pay | Admitting: Emergency Medicine

## 2014-07-17 DIAGNOSIS — K219 Gastro-esophageal reflux disease without esophagitis: Secondary | ICD-10-CM

## 2014-07-17 DIAGNOSIS — IMO0001 Reserved for inherently not codable concepts without codable children: Secondary | ICD-10-CM

## 2014-07-17 DIAGNOSIS — R079 Chest pain, unspecified: Secondary | ICD-10-CM | POA: Diagnosis not present

## 2014-07-17 HISTORY — DX: Vitamin D deficiency, unspecified: E55.9

## 2014-07-17 MED ORDER — RANITIDINE HCL 75 MG PO TABS: 75.0000 mg | ORAL_TABLET | Freq: Two times a day (BID) | ORAL | Status: DC

## 2014-07-17 MED ORDER — GI COCKTAIL ~~LOC~~
ORAL | Status: AC
  Filled 2014-07-17: qty 30

## 2014-07-17 MED ORDER — GI COCKTAIL ~~LOC~~
30.0000 mL | Freq: Once | ORAL | Status: AC
  Administered 2014-07-17: 30 mL via ORAL

## 2014-07-17 NOTE — ED Notes (Signed)
Pt came here today with chest pressure or "squeezing", 2/10 pain.  He was not concerned with this as he has had it several times before, but he is also experiencing some numbness and tingling in his tongue, left lower lip and left jaw.  Pt denies any headache or palpitations.  He states he was seen a few days ago for blurred vision as well.

## 2014-07-17 NOTE — Discharge Instructions (Signed)
THe cause of your chest pain is not immediately clear but is not likely from your . Your EKG does not show signs of heart attack. Your symptoms may be due to several things including reflux, thyroid dysfunction, stress, caffeine use, anxiety. Please decrease her caffeine use. Please use the Zantac for additional reflux relief. Please continue your workup with your other doctors regarding your vitamin D and thyroid dysfunction. Please consider taking a daily probiotic.

## 2014-07-17 NOTE — ED Provider Notes (Signed)
CSN: [id number]     Arrival date & time 07/17/14  1332 History   First MD Initiated Contact with Patient 07/17/14 1414     Chief Complaint  Patient presents with  . Chest Pain  . Numbness  . Tingling   (Consider location/radiation/quality/duration/timing/severity/associated sxs/prior Treatment) HPI  One hr ago developed tightness in chest. Associated w/ tingling to tongue and neck - comes and goes. Chest tightness is center to L chest. Symptoms came on while driving. Has had similar symptoms in the past and was told he has reflux. Chest tightness episodes occur daily and has for some time. Chest tightness symtppoms typically come on in the am or after meals in the evening. CUrrently on Maalox.   Qd stooling.   Patient drinks one foster caffeinated beverage per day  Currently undergoing workup for Graves disease which may be affecting his  Started Vit D 50,000 units Qwkly 3 days ago.   Denies nausea, vomiting, palpitations, shortness of breath, radiation of symptoms to his neck or shoulder, syncope.   Past Medical History  Diagnosis Date  . Depression   . Substance abuse   . Vitamin D deficiency    Past Surgical History  Procedure Laterality Date  . Wrist debridement     Family History  Problem Relation Age of Onset  . Hypertension Mother   . Cancer Other   . Diabetes Other    History  Substance Use Topics  . Smoking status: Former Games developer  . Smokeless tobacco: Not on file  . Alcohol Use: No    Review of Systems Per HPI with all other pertinent systems negative.   Allergies  Review of patient's allergies indicates no known allergies.  Home Medications   Prior to Admission medications   Medication Sig Start Date End Date Taking? Authorizing Provider  Vitamin D, Ergocalciferol, (DRISDOL) 50000 UNITS CAPS capsule Take 50,000 Units by mouth every 7 (seven) days.   Yes Historical Provider, MD  ranitidine (ZANTAC 75) 75 MG tablet Take 1 tablet (75 mg total) by mouth  2 (two) times daily. 07/17/14   Ozella Rocks, MD   BP 120/84 mmHg  Pulse 74  Temp(Src) 97 F (36.1 C) (Oral)  SpO2 96% Physical Exam Physical Exam  Constitutional: oriented to person, place, and time. appears well-developed and well-nourished. No distress.  HENT:  Head: Normocephalic and atraumatic.  Eyes: EOMI. PERRL.  Neck: Normal range of motion.  Cardiovascular: RRR, no m/r/g, 2+ distal pulses,  Pulmonary/Chest: Effort normal and breath sounds normal. No respiratory distress.  Abdominal: Soft. Bowel sounds are normal. NonTTP, no distension.  Musculoskeletal: Normal range of motion. Non ttp, no effusion.  Neurological: alert and oriented to person, place, and time. Negative [place] sign Skin: Skin is warm. No rash noted. non diaphoretic.  Psychiatric: normal mood and affect. behavior is normal. Judgment and thought content normal.   ED Course  Procedures (including critical care time) Labs Review Labs Reviewed - No data to display  Imaging Review No results found.   MDM   1. Chest pain, unspecified chest pain type   2. Reflux    Patient with chronic ongoing reflux. Does not tolerate PPIs. Patient given GI cocktail for relief of current symptoms. Start Zantac. Patient also start daily probiotic as he complains of frequent upper abdominal/epigastric gassy type pain. Etiology of chest pain not immediately clear but unlikely cardiac in nature. EKG unchanged from previous. No sign of ACS. May be due to in part to reflux esophagitis, stress, caffeine  use, anxiety. Patient to decrease his caffeine use over the next several days. Patient follow up in the emergency room if he gets significantly worse.  SENSATION in mouth and side effects unlikely due to hypocalcemia may be due to thyroid dysfunction as well as vitamin D deficiency and now ongoing treatment. Continue to emergency room if become significantly worse.     Ozella Rocksavid J Merrell, MD 07/17/14 [phone]

## 2014-09-17 IMAGING — US US ABDOMEN LIMITED
1 series · 14 of 25 positions shown · non-contrast
Comparison: Abdomen plain films of 11/30/2007

CLINICAL DATA: Right upper quadrant pain, abdominal bloating

LIMITED ABDOMINAL ULTRASOUND - RIGHT UPPER QUADRANT

[Series 1: us abdomen limited · 0.27mm/px · 14 of 46 slices shown]
[im 1/46]
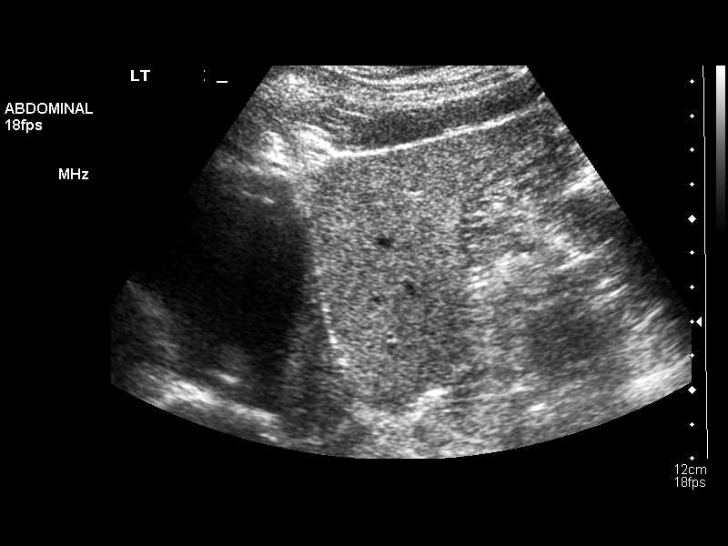
[im 4/46]
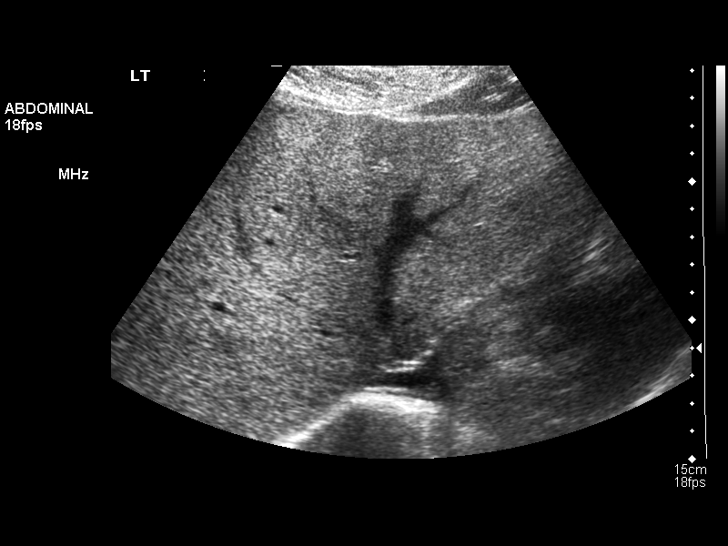
[im 8/46]
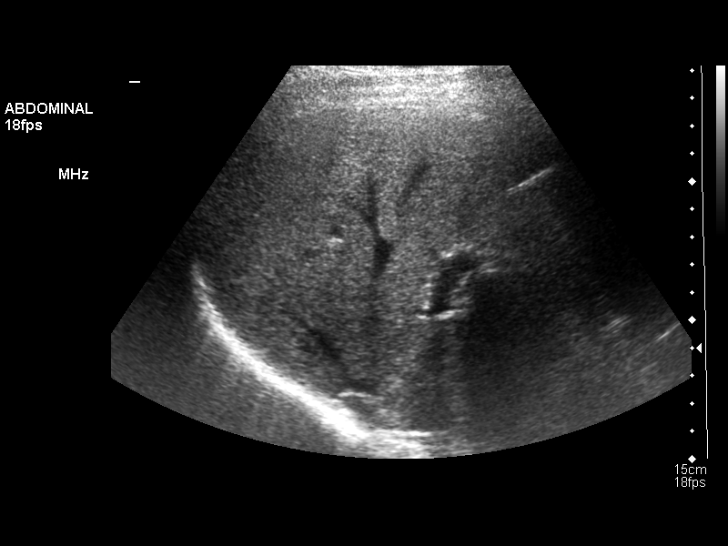
[im 12/46]
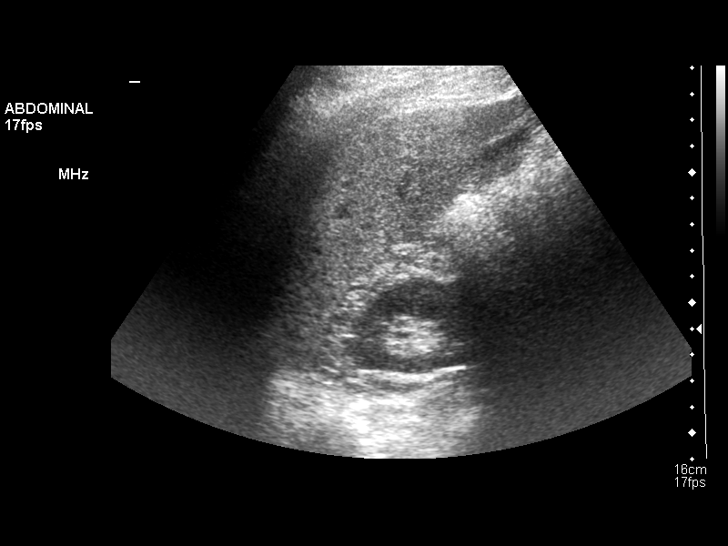
[im 16/46]
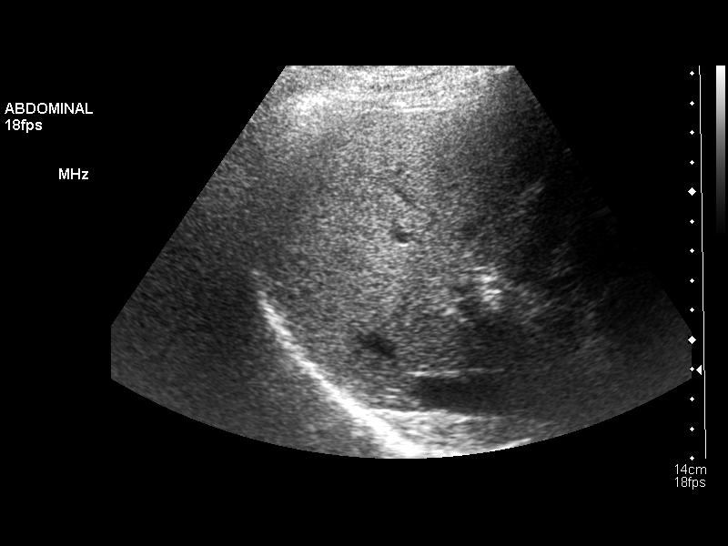
[im 17/46]
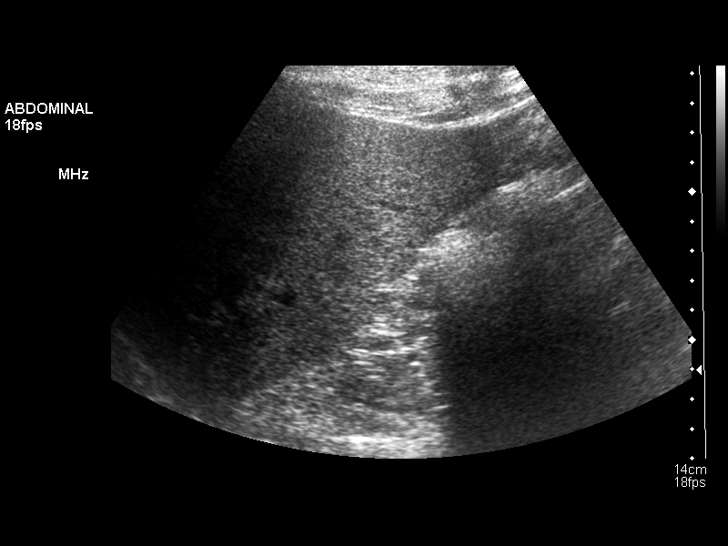
[im 21/46]
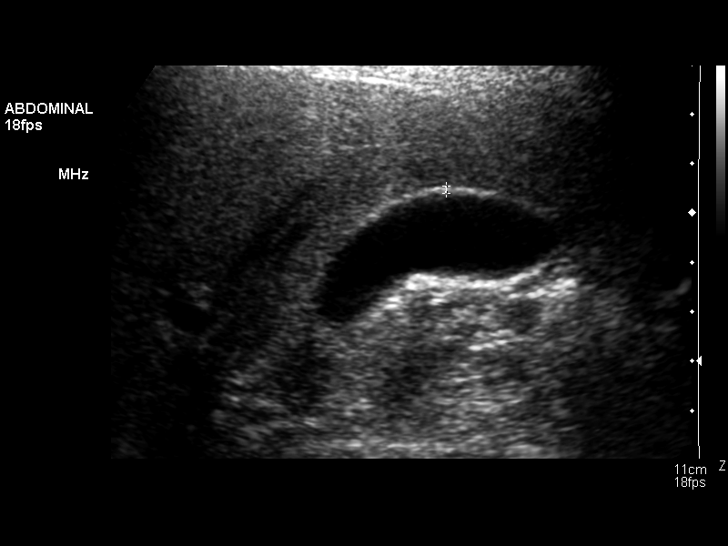
[im 25/46]
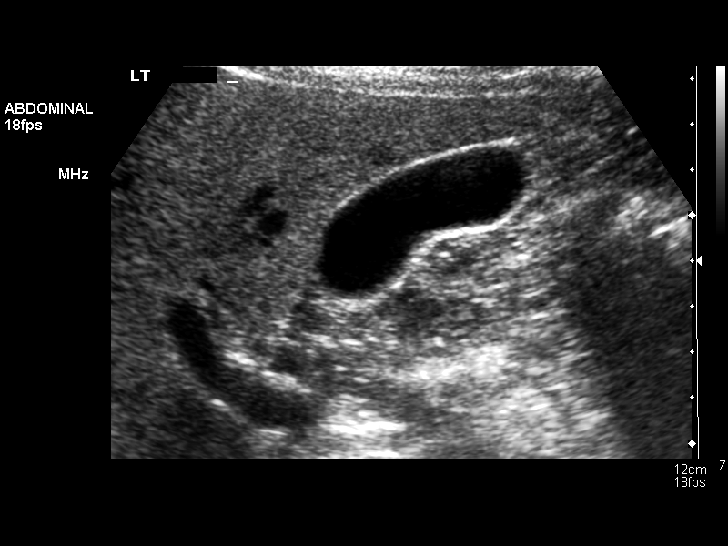
[im 29/46]
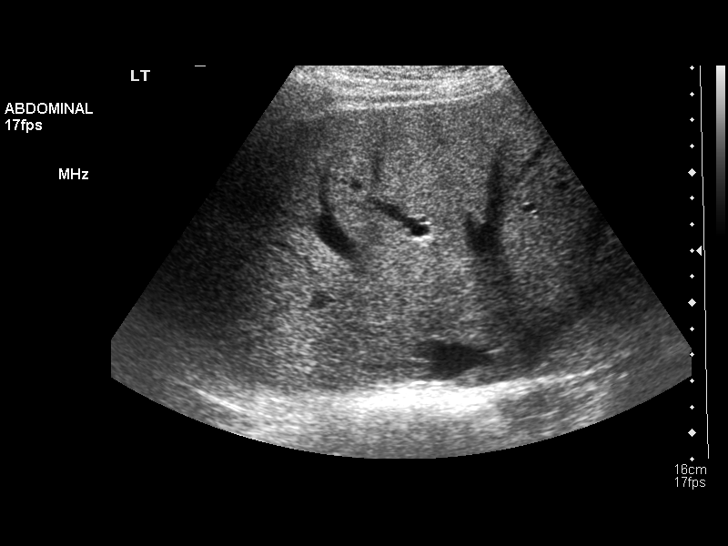
[im 31/46]
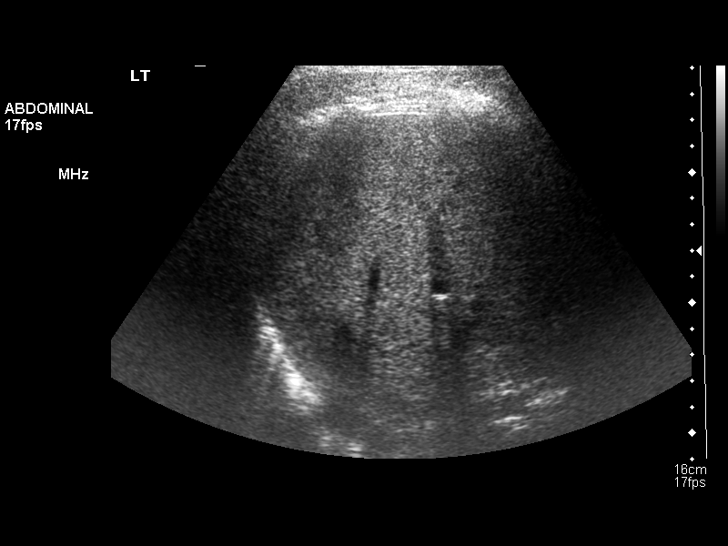
[im 34/46]
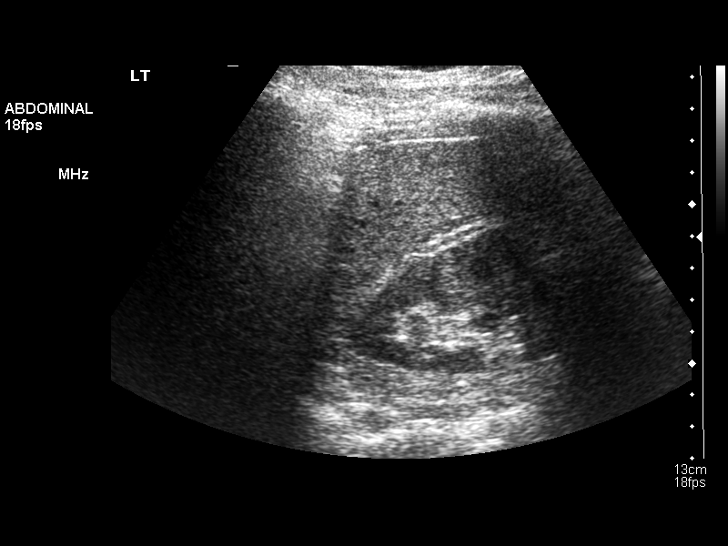
[im 38/46]
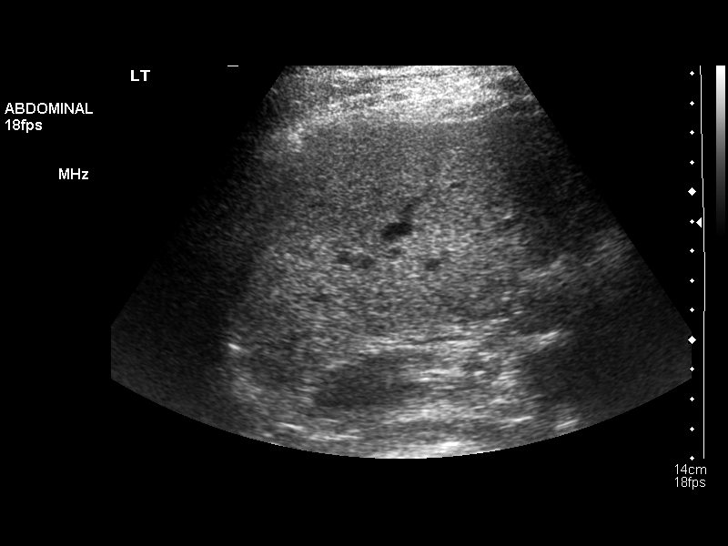
[im 42/46]
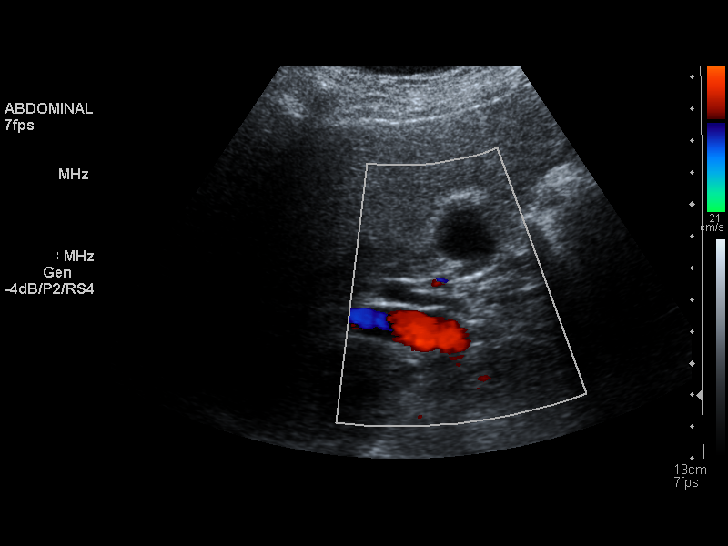
[im 46/46]
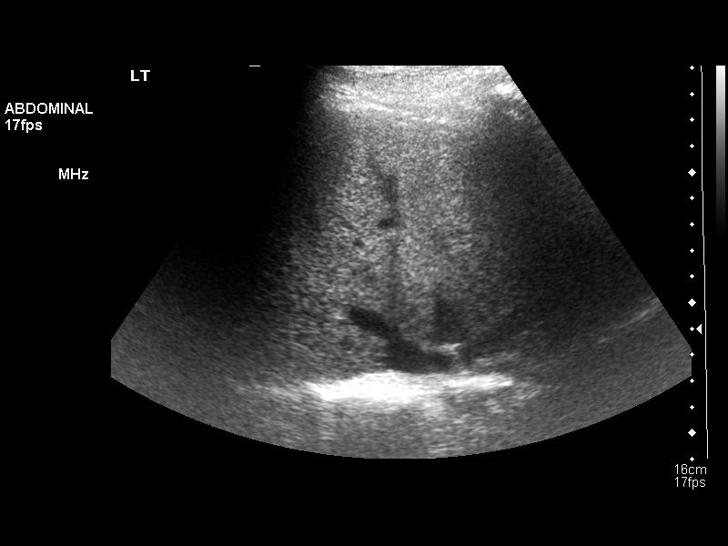

[14 of 25 positions shown; findings below may reference images not displayed]

FINDINGS: Gallbladder:  The gallbladder is visualized and no gallstones are
noted.  There is no pain over the gallbladder with compression.

Common bile duct:  The common bile duct is normal measuring 4.1 mm
in diameter.

Liver:  The liver is slightly echogenic suggesting mild fatty
infiltration with probable sparing near the gallbladder.
IMPRESSION: 1.  Suspect mild fatty infiltration of the liver with areas of
sparing.
2.  No gallstones.

## 2020-06-25 NOTE — Progress Notes (Signed)
  Subjective:    Daniel Lloyd - 55 y.o. male MRN [id number]  Date of birth: 08-13-1965  HPI  Daniel Lloyd is to establish care. Patient has a PMH significant for depression, substance abuse, and vitamin D deficiency.   Current issues and/or concerns: None   ROS per HPI    Health Maintenance:  Health Maintenance Due  Topic Date Due  . HIV Screening  Never done  . Hepatitis C Screening  Never done  . COLONOSCOPY (Pts 45-6yrs Insurance coverage will need to be confirmed)  Never done  . COVID-19 Vaccine (3 - Booster for Moderna series) 12/09/2019    Past Medical History: There are no problems to display for this patient.   Social History   reports that he has quit smoking. His smoking use included cigarettes. He has never used smokeless tobacco. He reports that he does not drink alcohol and does not use drugs.   Family History  family history includes Cancer in an other family member; Diabetes in an other family member; Hypertension in his mother.   Medications: reviewed and updated   Objective:   Physical Exam BP 129/89 (BP Location: Left Arm, Patient Position: Sitting)   Pulse 75   Ht 5' 9.29" (1.76 m)   Wt 248 lb (112.5 kg)   SpO2 94%   BMI 36.32 kg/m  Physical Exam HENT:     Head: Normocephalic and atraumatic.  Eyes:     Extraocular Movements: Extraocular movements intact.     Conjunctiva/sclera: Conjunctivae normal.     Pupils: Pupils are equal, round, and reactive to light.  Cardiovascular:     Rate and Rhythm: Normal rate and regular rhythm.     Pulses: Normal pulses.     Heart sounds: Normal heart sounds.  Pulmonary:     Effort: Pulmonary effort is normal.     Breath sounds: Normal breath sounds.  Musculoskeletal:     Cervical back: Normal range of motion and neck supple.  Neurological:     General: No focal deficit present.     Mental Status: He is alert and oriented to person, place, and time.  Psychiatric:        Mood and Affect: Mood normal.         Behavior: Behavior normal.      Assessment & Plan:  1. Encounter to establish care: - Patient presents today to establish care.  - Return for annual physical examination, labs, and health maintenance. Arrive fasting meaning having no food for at least 8 hours prior to appointment. You may have only water or black coffee. Please take scheduled medications as normal.   Patient was given clear instructions to go to Emergency Department or return to medical center if symptoms don't improve, worsen, or new problems develop.The patient verbalized understanding.  I discussed the assessment and treatment plan with the patient. The patient was provided an opportunity to ask questions and all were answered. The patient agreed with the plan and demonstrated an understanding of the instructions.   The patient was advised to call back or seek an in-person evaluation if the symptoms worsen or if the condition fails to improve as anticipated.    Ricky Stabs, NP 06/30/2020, 9:46 AM Primary Care at [facility]

## 2020-06-26 ENCOUNTER — Encounter: Payer: Self-pay | Admitting: Family

## 2020-06-26 ENCOUNTER — Other Ambulatory Visit: Payer: Self-pay

## 2020-06-26 ENCOUNTER — Ambulatory Visit: Payer: No Typology Code available for payment source | Admitting: Family

## 2020-06-26 VITALS — BP 129/89 | HR 75 | Ht 69.29 in | Wt 248.0 lb

## 2020-06-26 DIAGNOSIS — Z7689 Persons encountering health services in other specified circumstances: Secondary | ICD-10-CM | POA: Diagnosis not present

## 2020-06-26 NOTE — Progress Notes (Signed)
Establish care

## 2020-06-26 NOTE — Patient Instructions (Addendum)
- Return for annual physical examination, labs, and health maintenance. Arrive fasting meaning having no for at least 8 hours prior to appointment. You may have only water or black coffee. Please take scheduled medications as normal. Thank you for choosing Primary Care at [facility] for your medical home!    Daniel Lloyd was seen by Rema Fendt, NP today.   Kate Sable primary care provider is Rema Fendt, NP.   For the best care possible,  you should try to see Ricky Stabs, NP whenever you come to clinic.   We look forward to seeing you again soon!  If you have any questions about your visit today,  please call us at [phone]  Or feel free to reach your provider via MyChart.   Keeping you healthy   Get these tests  Blood pressure- Have your blood pressure checked once a year by your healthcare provider.  Normal blood pressure is 120/80.  Weight- Have your body mass index (BMI) calculated to screen for obesity.  BMI is a measure of body fat based on height and weight. You can also calculate your own BMI at https://www.west-esparza.com/.  Cholesterol- Have your cholesterol checked regularly starting at age 33, sooner may be necessary if you have diabetes, high blood pressure, if a family member developed heart diseases at an early age or if you smoke.   Chlamydia, HIV, and other sexual transmitted disease- Get screened each year until the age of 38 then within three months of each new sexual partner.  Diabetes- Have your blood sugar checked regularly if you have high blood pressure, high cholesterol, a family history of diabetes or if you are overweight.   Get these vaccines  Flu shot- Every fall.  Tetanus shot- Every 10 years.  Menactra- Single dose; prevents meningitis.   Take these steps  Don't smoke- If you do smoke, ask your healthcare provider about quitting. For tips on how to quit, go to www.smokefree.gov or call 1-800-QUIT-NOW.  Be physically active-  Exercise 5 days a week for at least 30 minutes.  If you are not already physically active start slow and gradually work up to 30 minutes of moderate physical activity.  Examples of moderate activity include walking briskly, mowing the yard, dancing, swimming bicycling, etc.  Eat a healthy diet- Eat a variety of healthy foods such as fruits, vegetables, low fat milk, low fat cheese, yogurt, lean meats, poultry, fish, beans, tofu, etc.  For more information on healthy eating, go to www.thenutritionsource.org  Drink alcohol in moderation- Limit alcohol intake two drinks or less a day.  Never drink and drive.  Dentist- Brush and floss teeth twice daily; visit your dentis twice a year.  Depression-Your emotional health is as important as your physical health.  If you're feeling down, losing interest in things you normally enjoy please talk with your healthcare provider.  Gun Safety- If you keep a gun in your home, keep it unloaded and with the safety lock on.  Bullets should be stored separately.  Helmet use- Always wear a helmet when riding a motorcycle, bicycle, rollerblading or skateboarding.  Safe sex- If you may be exposed to a sexually transmitted infection, use a condom  Seat belts- Seat bels can save your life; always wear one.  Smoke/Carbon Monoxide detectors- These detectors need to be installed on the appropriate level of your home.  Replace batteries at least once a year.  Skin Cancer- When out in the sun, cover up and use  sunscreen SPF 15 or higher.  Violence- If anyone is threatening or hurting you, please tell your healthcare provider.

## 2020-07-17 NOTE — Progress Notes (Signed)
Patient ID: CASEN PRYOR, male    DOB: Mar 24, 1965  MRN: [id number]  CC: Annual Physical Exam  Subjective: Daniel Lloyd is a 55 y.o. male who presents for annual physical exam.   His concerns today include: Mild eczema especially located at right elbow, remaining stable, not interested in medication at present. Folliculitis remaining stable, does have flares here and there.   Patient Active Problem List   Diagnosis Date Noted  . Impacted cerumen of both ears 07/18/2020  . Dry eye syndrome of bilateral lacrimal glands 07/18/2020  . Encounter for immunization 07/18/2020  . Gastroesophageal reflux disease 07/18/2020  . Hematuria 07/18/2020  . Myalgia 07/18/2020  . Obesity 07/18/2020  . Vitamin D deficiency 07/18/2020     No current outpatient medications on file prior to visit.   No current facility-administered medications on file prior to visit.    No Known Allergies  Social History   Socioeconomic History  . Marital status: Married    Spouse name: Not on file  . Number of children: Not on file  . Years of education: Not on file  . Highest education level: Not on file  Occupational History  . Not on file  Tobacco Use  . Smoking status: Former Smoker    Types: Cigarettes  . Smokeless tobacco: Never Used  Vaping Use  . Vaping Use: Never used  Substance and Sexual Activity  . Alcohol use: No  . Drug use: No  . Sexual activity: Yes  Other Topics Concern  . Not on file  Social History Narrative  . Not on file   Social Determinants of Health   Financial Resource Strain: Not on file  Food Insecurity: Not on file  Transportation Needs: Not on file  Physical Activity: Not on file  Stress: Not on file  Social Connections: Not on file  Intimate Partner Violence: Not on file    Family History  Problem Relation Age of Onset  . Hypertension Mother   . Cancer Other   . Diabetes Other     Past Surgical History:  Procedure Laterality Date  . VASECTOMY N/A     [employer]reesia 06/23/2020  . WRIST DEBRIDEMENT      ROS: Review of Systems Negative except as stated above  PHYSICAL EXAM: BP 112/80 (BP Location: Left Arm, Patient Position: Sitting, Cuff Size: Normal)   Pulse 76   Temp 98.4 F (36.9 C)   Resp 16   Ht 5' 9.29" (1.76 m)   Wt 250 lb 12.8 oz (113.8 kg)   SpO2 97%   BMI 36.73 kg/m   Physical Exam HENT:     Head: Normocephalic and atraumatic.     Right Ear: Tympanic membrane, ear canal and external ear normal.     Left Ear: Tympanic membrane, ear canal and external ear normal.  Eyes:     Extraocular Movements: Extraocular movements intact.     Conjunctiva/sclera: Conjunctivae normal.     Pupils: Pupils are equal, round, and reactive to light.  Cardiovascular:     Rate and Rhythm: Normal rate and regular rhythm.     Pulses: Normal pulses.     Heart sounds: Normal heart sounds.  Pulmonary:     Effort: Pulmonary effort is normal.     Breath sounds: Normal breath sounds.  Abdominal:     General: Bowel sounds are normal.     Palpations: Abdomen is soft.  Genitourinary:    Comments: Patient declined examination. Musculoskeletal:  General: Normal range of motion.     Cervical back: Normal range of motion and neck supple.  Skin:    Capillary Refill: Capillary refill takes less than 2 seconds.     Comments: Folliculitis area located at lower nape. Clean and intact skin. No evidence of bumps or drainage. Patient reports area intermittently flares. For the most part remaining stable. Eczema located right elbow and lower arm area, remaining stable.   Neurological:     General: No focal deficit present.     Mental Status: He is alert and oriented to person, place, and time.  Psychiatric:        Mood and Affect: Mood normal.        Behavior: Behavior normal.      ASSESSMENT AND PLAN: 1. Annual physical exam: - Counseled on 150 minutes of exercise per week as tolerated, healthy eating (including decreased daily intake of  saturated fats, cholesterol, added sugars, sodium), STI prevention, and routine healthcare maintenance.  2. Screening for metabolic disorder:  - CMP to check kidney function, liver function, and electrolyte balance.  - Comprehensive metabolic panel  3. Screening for deficiency anemia: - CBC to screen for anemia. - CBC  4. Diabetes mellitus screening: - Hemoglobin A1c to screen for pre-diabetes/diabetes. - Hemoglobin A1c  5. Screening cholesterol level: - Lipid panel to screen for high cholesterol.  - Lipid panel  6. Thyroid disorder screen: - TSH to check thyroid function.  - TSH  7. Need for hepatitis C screening test: - Hepatitis C antibody to screen for hepatitis C.  - Hepatitis C Antibody  8. Encounter for screening for HIV: - HIV antibody to screen for human immunodeficiency virus.  - HIV antibody (with reflex)  9. Colon cancer screening: - Patient declined reports [facility] and not due for a repeat until about 6 years from now.  10. Prostate cancer screening: - PSA to screen for prostate function.  - PSA  11. Encounter for vitamin deficiency screening: - Vitamin D, 25-hydroxy to screen for deficiency.  - Vitamin D, 25-hydroxy  12. Folliculitis: - Declined pharmacological treatment at present.  - Declined referral to Dermatology at the moment.   13. Eczema, unspecified type: - Declined pharmacological treatment at present.  - Declined referral to Dermatology at the moment.    Patient was given the opportunity to ask questions.  Patient verbalized understanding of the plan and was able to repeat key elements of the plan. Patient was given clear instructions to go to Emergency Department or return to medical center if symptoms don't improve, worsen, or new problems develop.The patient verbalized understanding.   Orders Placed This Encounter  Procedures  . HIV antibody (with reflex)  . Hepatitis C Antibody  . CBC  .  Comprehensive metabolic panel  . Lipid panel  . TSH  . Hemoglobin A1c  . PSA    Follow-up with primary provider as scheduled.   Rema Fendt, NP

## 2020-07-18 ENCOUNTER — Encounter: Payer: Self-pay | Admitting: Family

## 2020-07-18 ENCOUNTER — Ambulatory Visit (INDEPENDENT_AMBULATORY_CARE_PROVIDER_SITE_OTHER): Payer: No Typology Code available for payment source | Admitting: Family

## 2020-07-18 ENCOUNTER — Other Ambulatory Visit: Payer: Self-pay

## 2020-07-18 VITALS — BP 112/80 | HR 76 | Temp 98.4°F | Resp 16 | Ht 69.29 in | Wt 250.8 lb

## 2020-07-18 DIAGNOSIS — K219 Gastro-esophageal reflux disease without esophagitis: Secondary | ICD-10-CM | POA: Insufficient documentation

## 2020-07-18 DIAGNOSIS — M609 Myositis, unspecified: Secondary | ICD-10-CM

## 2020-07-18 DIAGNOSIS — Z131 Encounter for screening for diabetes mellitus: Secondary | ICD-10-CM | POA: Diagnosis not present

## 2020-07-18 DIAGNOSIS — R319 Hematuria, unspecified: Secondary | ICD-10-CM

## 2020-07-18 DIAGNOSIS — Z1159 Encounter for screening for other viral diseases: Secondary | ICD-10-CM

## 2020-07-18 DIAGNOSIS — Z114 Encounter for screening for human immunodeficiency virus [HIV]: Secondary | ICD-10-CM

## 2020-07-18 DIAGNOSIS — M791 Myalgia, unspecified site: Secondary | ICD-10-CM | POA: Insufficient documentation

## 2020-07-18 DIAGNOSIS — Z0000 Encounter for general adult medical examination without abnormal findings: Secondary | ICD-10-CM | POA: Diagnosis not present

## 2020-07-18 DIAGNOSIS — Z1329 Encounter for screening for other suspected endocrine disorder: Secondary | ICD-10-CM

## 2020-07-18 DIAGNOSIS — Z130 Encounter for screening for diseases of the blood and blood-forming organs and certain disorders involving the immune mechanism: Secondary | ICD-10-CM | POA: Diagnosis not present

## 2020-07-18 DIAGNOSIS — Z1211 Encounter for screening for malignant neoplasm of colon: Secondary | ICD-10-CM

## 2020-07-18 DIAGNOSIS — H6123 Impacted cerumen, bilateral: Secondary | ICD-10-CM | POA: Insufficient documentation

## 2020-07-18 DIAGNOSIS — Z13228 Encounter for screening for other metabolic disorders: Secondary | ICD-10-CM | POA: Diagnosis not present

## 2020-07-18 DIAGNOSIS — E559 Vitamin D deficiency, unspecified: Secondary | ICD-10-CM | POA: Insufficient documentation

## 2020-07-18 DIAGNOSIS — H04123 Dry eye syndrome of bilateral lacrimal glands: Secondary | ICD-10-CM | POA: Insufficient documentation

## 2020-07-18 DIAGNOSIS — Z1321 Encounter for screening for nutritional disorder: Secondary | ICD-10-CM

## 2020-07-18 DIAGNOSIS — L739 Follicular disorder, unspecified: Secondary | ICD-10-CM

## 2020-07-18 DIAGNOSIS — Z13220 Encounter for screening for lipoid disorders: Secondary | ICD-10-CM

## 2020-07-18 DIAGNOSIS — Z125 Encounter for screening for malignant neoplasm of prostate: Secondary | ICD-10-CM

## 2020-07-18 DIAGNOSIS — Z23 Encounter for immunization: Secondary | ICD-10-CM | POA: Insufficient documentation

## 2020-07-18 DIAGNOSIS — E669 Obesity, unspecified: Secondary | ICD-10-CM | POA: Insufficient documentation

## 2020-07-18 DIAGNOSIS — L309 Dermatitis, unspecified: Secondary | ICD-10-CM

## 2020-07-18 HISTORY — DX: Encounter for immunization: Z23

## 2020-07-18 HISTORY — DX: Dry eye syndrome of bilateral lacrimal glands: H04.123

## 2020-07-18 HISTORY — DX: Gastro-esophageal reflux disease without esophagitis: K21.9

## 2020-07-18 HISTORY — DX: Hematuria, unspecified: R31.9

## 2020-07-18 HISTORY — DX: Impacted cerumen, bilateral: H61.23

## 2020-07-18 HISTORY — DX: Myositis, unspecified: M60.9

## 2020-07-18 NOTE — Patient Instructions (Signed)

## 2020-07-18 NOTE — Progress Notes (Signed)
Annual physical exam  Desires PSA test

## 2020-07-19 ENCOUNTER — Other Ambulatory Visit: Payer: Self-pay | Admitting: Family

## 2020-07-19 DIAGNOSIS — E559 Vitamin D deficiency, unspecified: Secondary | ICD-10-CM

## 2020-07-19 DIAGNOSIS — R7303 Prediabetes: Secondary | ICD-10-CM

## 2020-07-19 DIAGNOSIS — E785 Hyperlipidemia, unspecified: Secondary | ICD-10-CM

## 2020-07-19 LAB — COMPREHENSIVE METABOLIC PANEL
ALT: 20 IU/L (ref 0–44)
AST: 14 IU/L (ref 0–40)
Albumin/Globulin Ratio: 1.7 (ref 1.2–2.2)
Albumin: 4.3 g/dL (ref 3.8–4.9)
Alkaline Phosphatase: 34 IU/L — ABNORMAL LOW (ref 44–121)
BUN/Creatinine Ratio: 11 (ref 9–20)
BUN: 12 mg/dL (ref 6–24)
Bilirubin Total: 0.5 mg/dL (ref 0.0–1.2)
CO2: 22 mmol/L (ref 20–29)
Calcium: 8.8 mg/dL (ref 8.7–10.2)
Chloride: 108 mmol/L — ABNORMAL HIGH (ref 96–106)
Creatinine, Ser: 1.11 mg/dL (ref 0.76–1.27)
Globulin, Total: 2.6 g/dL (ref 1.5–4.5)
Glucose: 82 mg/dL (ref 65–99)
Potassium: 4.3 mmol/L (ref 3.5–5.2)
Sodium: 143 mmol/L (ref 134–144)
Total Protein: 6.9 g/dL (ref 6.0–8.5)
eGFR: 78 mL/min/{1.73_m2} (ref >59)

## 2020-07-19 LAB — CBC
Hematocrit: 45.5 % (ref 37.5–51.0)
Hemoglobin: 15.2 g/dL (ref 13.0–17.7)
MCH: 29.1 pg (ref 26.6–33.0)
MCHC: 33.4 g/dL (ref 31.5–35.7)
MCV: 87 fL (ref 79–97)
Platelets: 192 10*3/uL (ref 150–450)
RBC: 5.22 x10E6/uL (ref 4.14–5.80)
RDW: 13.2 % (ref 11.6–15.4)
WBC: 3.6 10*3/uL (ref 3.4–10.8)

## 2020-07-19 LAB — LIPID PANEL
Chol/HDL Ratio: 4.6 ratio (ref 0.0–5.0)
Cholesterol, Total: 179 mg/dL (ref 100–199)
HDL: 39 mg/dL — ABNORMAL LOW (ref >39)
LDL Chol Calc (NIH): 132 mg/dL — ABNORMAL HIGH (ref 0–99)
Triglycerides: 40 mg/dL (ref 0–149)
VLDL Cholesterol Cal: 8 mg/dL (ref 5–40)

## 2020-07-19 LAB — HEPATITIS C ANTIBODY: Hep C Virus Ab: 0.1 s/co ratio (ref 0.0–0.9)

## 2020-07-19 LAB — VITAMIN D 25 HYDROXY (VIT D DEFICIENCY, FRACTURES): Vit D, 25-Hydroxy: 17.7 ng/mL — ABNORMAL LOW (ref 30.0–100.0)

## 2020-07-19 LAB — HEMOGLOBIN A1C
Est. average glucose Bld gHb Est-mCnc: 120 mg/dL
Hgb A1c MFr Bld: 5.8 % — ABNORMAL HIGH (ref 4.8–5.6)

## 2020-07-19 LAB — TSH: TSH: 2 u[IU]/mL (ref 0.450–4.500)

## 2020-07-19 LAB — PSA: Prostate Specific Ag, Serum: 0.5 ng/mL (ref 0.0–4.0)

## 2020-07-19 LAB — HIV ANTIBODY (ROUTINE TESTING W REFLEX): HIV Screen 4th Generation wRfx: NONREACTIVE

## 2020-07-19 MED ORDER — VITAMIN D3 25 MCG (1000 UT) PO CAPS: 1000.0000 [IU] | ORAL_CAPSULE | Freq: Every day | ORAL | 0 refills | Status: AC

## 2020-07-19 MED ORDER — ATORVASTATIN CALCIUM 40 MG PO TABS: 40.0000 mg | ORAL_TABLET | Freq: Every day | ORAL | 0 refills | Status: DC

## 2020-07-19 NOTE — Progress Notes (Signed)
Kidney function normal.   Liver function normal.   Thyroid function normal.  No anemia.  Hepatitis C negative.   HIV negative.   PSA to screen for prostate, normal.   Vitamin D level low. Begin Cholecalciferol daily supplement for the next 16 weeks. Patient encouraged to have rechecked at that time.  Hemoglobin A1c is consistent with pre-diabetes. Practice healthy eating habits of fresh fruit and vegetables, lean baked meats such as chicken, fish, and [place]; limit breads, rice, pastas, and desserts; practice regular aerobic exercise (at least 150 minutes a week as tolerated). Patient encouraged to have rechecked in 6 months.   Cholesterol higher than expected. High cholesterol may increase risk of heart attack and/or stroke. Consider eating more fruits, vegetables, and lean baked meats such as chicken or fish. Moderate intensity exercise at least 150 minutes as tolerated per week may help as well. Patient encouraged to have rechecked in 3 to 6 months.   Begin Atorvastatin for high cholesterol.   The following is for provider reference only: The 10-year ASCVD risk score Denman George DC Montez Hageman., et al., 2013) is: 9.3%   Values used to calculate the score:     Age: 55 years     Sex: Male     Is Non-Hispanic African American: Yes     Diabetic: No     Tobacco smoker: Yes     Systolic Blood Pressure: 112 mmHg     Is BP treated: No     HDL Cholesterol: 39 mg/dL     Total Cholesterol: 179 mg/dL

## 2020-10-05 ENCOUNTER — Ambulatory Visit (HOSPITAL_COMMUNITY)
Admission: EM | Admit: 2020-10-05 | Discharge: 2020-10-05 | Disposition: A | Discharge status: Home / Self Care | Payer: No Typology Code available for payment source | Attending: Student | Admitting: Student

## 2020-10-05 ENCOUNTER — Other Ambulatory Visit: Payer: Self-pay

## 2020-10-05 ENCOUNTER — Encounter (HOSPITAL_COMMUNITY): Payer: Self-pay

## 2020-10-05 DIAGNOSIS — S161XXA Strain of muscle, fascia and tendon at neck level, initial encounter: Secondary | ICD-10-CM | POA: Diagnosis not present

## 2020-10-05 MED ORDER — TIZANIDINE HCL 2 MG PO TABS: 2.0000 mg | ORAL_TABLET | Freq: Four times a day (QID) | ORAL | 0 refills | Status: DC | PRN

## 2020-10-05 NOTE — ED Triage Notes (Signed)
Pt presents with right side neck pain that radiates around to back of neck X 4 days.

## 2020-10-05 NOTE — ED Provider Notes (Signed)
[facility]    CSN: [id number] Arrival date & time: 10/05/20  1540      History   Chief Complaint Chief Complaint  Patient presents with   Neck Pain    HPI BREVIN MCFADDEN is a 55 y.o. male presenting with R neck pain x4 days. Medical history prediabetes, hyperlipidemia. Pain with movement. Radiating from R clavicle to to R ear. Denies hearing changes, dizziness, tinnitus. Denies pain in mouth or throat, denies teeth pain, denies foul taste in mouth, denies pain with swallowing. Denies recent URI, cough, congestion.  HPI  Past Medical History:  Diagnosis Date   Depression    Substance abuse ([facility])    Vitamin D deficiency     Patient Active Problem List   Diagnosis Date Noted   Prediabetes 07/19/2020   Hyperlipidemia 07/19/2020   Impacted cerumen of both ears 07/18/2020   Dry eye syndrome of bilateral lacrimal glands 07/18/2020   Encounter for immunization 07/18/2020   Gastroesophageal reflux disease 07/18/2020   Hematuria 07/18/2020   Myalgia 07/18/2020   Obesity 07/18/2020   Vitamin D deficiency 07/18/2020    Past Surgical History:  Procedure Laterality Date   VASECTOMY N/A    [employer]reesia 06/23/2020   WRIST DEBRIDEMENT         Home Medications    Prior to Admission medications   Medication Sig Start Date End Date Taking? Authorizing Provider  tiZANidine (ZANAFLEX) 2 MG tablet Take 1 tablet (2 mg total) by mouth every 6 (six) hours as needed for muscle spasms. 10/05/20  Yes Rhys Martini, PA-C  atorvastatin (LIPITOR) 40 MG tablet Take 1 tablet (40 mg total) by mouth daily. 07/19/20   Rema Fendt, NP  Cholecalciferol (VITAMIN D3) 25 MCG (1000 UT) CAPS Take 1 capsule (1,000 Units total) by mouth daily. 07/19/20   Rema Fendt, NP    Family History Family History  Problem Relation Age of Onset   Hypertension Mother    Cancer Other    Diabetes Other     Social History Social History   Tobacco Use   Smoking status: Former    Types:  Cigarettes   Smokeless tobacco: Never  Vaping Use   Vaping Use: Never used  Substance Use Topics   Alcohol use: No   Drug use: No     Allergies   Patient has no known allergies.   Review of Systems Review of Systems  Musculoskeletal:        R neck pain  All other systems reviewed and are negative.   Physical Exam Triage Vital Signs ED Triage Vitals  Enc Vitals Group     BP 10/05/20 1652 119/88     Pulse Rate 10/05/20 1652 64     Resp 10/05/20 1652 17     Temp 10/05/20 1652 98.2 F (36.8 C)     Temp Source 10/05/20 1652 Oral     SpO2 10/05/20 1652 100 %     Weight --      Height --      Head Circumference --      Peak Flow --      Pain Score 10/05/20 1653 7     Pain Loc --      Pain Edu? --      Excl. in GC? --    No data found.  Updated Vital Signs BP 119/88 (BP Location: Right Arm)   Pulse 64   Temp 98.2 F (36.8 C) (Oral)   Resp 17  SpO2 100%   Visual Acuity Right Eye Distance:   Left Eye Distance:   Bilateral Distance:    Right Eye Near:   Left Eye Near:    Bilateral Near:     Physical Exam Vitals reviewed.  Constitutional:      General: He is not in acute distress.    Appearance: Normal appearance. He is not ill-appearing.  HENT:     Head: Normocephalic and atraumatic.     Right Ear: Tympanic membrane, ear canal and external ear normal. No tenderness. No middle ear effusion. There is no impacted cerumen. Tympanic membrane is not perforated, erythematous, retracted or bulging.     Left Ear: Tympanic membrane, ear canal and external ear normal. No tenderness.  No middle ear effusion. There is no impacted cerumen. Tympanic membrane is not perforated, erythematous, retracted or bulging.     Nose: Nose normal. No congestion.     Mouth/Throat:     Mouth: Mucous membranes are moist.     Pharynx: Uvula midline. No oropharyngeal exudate or posterior oropharyngeal erythema.     Comments: No gingival swelling or tenderness No trismus, drooling, sore  throat, voice changes, swelling underneath the tongue, swelling underneath the jaw, neck stiffness.  Eyes:     Extraocular Movements: Extraocular movements intact.     Pupils: Pupils are equal, round, and reactive to light.     Comments: Visual acuity grossly intact   Cardiovascular:     Rate and Rhythm: Normal rate and regular rhythm.     Heart sounds: Normal heart sounds.  Pulmonary:     Effort: Pulmonary effort is normal.     Breath sounds: Normal breath sounds. No decreased breath sounds, wheezing, rhonchi or rales.  Abdominal:     Palpations: Abdomen is soft.     Tenderness: There is no abdominal tenderness. There is no guarding or rebound.  Musculoskeletal:     Comments: TTP R SCM muscle No cervical or trapezius tenderness. No midline spineous tenderness or deformity.  Neurological:     General: No focal deficit present.     Mental Status: He is alert and oriented to person, place, and time.  Psychiatric:        Mood and Affect: Mood normal.        Behavior: Behavior normal.        Thought Content: Thought content normal.        Judgment: Judgment normal.     UC Treatments / Results  Labs (all labs ordered are listed, but only abnormal results are displayed) Labs Reviewed - No data to display  EKG   Radiology No results found.  Procedures Procedures (including critical care time)  Medications Ordered in UC Medications - No data to display  Initial Impression / Assessment and Plan / UC Course  I have reviewed the triage vital signs and the nursing notes.  Pertinent labs & imaging results that were available during my care of the patient were reviewed by me and considered in my medical decision making (see chart for details).     This patient is a very pleasant 55 y.o. year old male presenting with R trapezius strain, nontraumatic. No red flag symptoms. Trial of zanaflex. ED return precautions discussed. Patient verbalizes understanding and agreement.  ..    Final Clinical Impressions(s) / UC Diagnoses   Final diagnoses:  Strain of sternocleidomastoid muscle, initial encounter     Discharge Instructions      -Start the muscle relaxer-Zanaflex (tizanidine), up to 3  times daily for muscle spasms and pain.  This can make you drowsy, so take at bedtime or when you do not need to drive or operate machinery. -You can take Tylenol up to 1000 mg 3 times daily, and ibuprofen up to 800 mg 3 times daily with food.  You can take these together, or alternate every 3-4 hours.       ED Prescriptions     Medication Sig Dispense Auth. Provider   tiZANidine (ZANAFLEX) 2 MG tablet Take 1 tablet (2 mg total) by mouth every 6 (six) hours as needed for muscle spasms. 21 tablet Rhys Martini, PA-C      PDMP not reviewed this encounter.   Rhys Martini, PA-C 10/05/20 1809

## 2020-10-05 NOTE — Discharge Instructions (Addendum)
-  Start the muscle relaxer-Zanaflex (tizanidine), up to 3 times daily for muscle spasms and pain.  This can make you drowsy, so take at bedtime or when you do not need to drive or operate machinery. -You can take Tylenol up to 1000 mg 3 times daily, and ibuprofen up to 800 mg 3 times daily with food.  You can take these together, or alternate every 3-4 hours.

## 2021-03-05 NOTE — Progress Notes (Signed)
Erroneous encounter

## 2021-03-11 ENCOUNTER — Encounter: Payer: No Typology Code available for payment source | Admitting: Family

## 2021-03-11 DIAGNOSIS — E785 Hyperlipidemia, unspecified: Secondary | ICD-10-CM

## 2021-03-11 DIAGNOSIS — R7303 Prediabetes: Secondary | ICD-10-CM

## 2021-11-15 ENCOUNTER — Encounter (HOSPITAL_COMMUNITY): Payer: Self-pay | Admitting: Emergency Medicine

## 2021-11-15 ENCOUNTER — Ambulatory Visit (HOSPITAL_COMMUNITY)
Admission: EM | Admit: 2021-11-15 | Discharge: 2021-11-15 | Disposition: A | Discharge status: Home / Self Care | Payer: No Typology Code available for payment source | Attending: Emergency Medicine | Admitting: Emergency Medicine

## 2021-11-15 DIAGNOSIS — H9202 Otalgia, left ear: Secondary | ICD-10-CM

## 2021-11-15 HISTORY — DX: Pure hypercholesterolemia, unspecified: E78.00

## 2021-11-15 MED ORDER — AMOXICILLIN 500 MG PO CAPS: 500.0000 mg | ORAL_CAPSULE | Freq: Two times a day (BID) | ORAL | 0 refills | Status: AC

## 2021-11-15 MED ORDER — CETIRIZINE HCL 10 MG PO TABS: 10.0000 mg | ORAL_TABLET | Freq: Every day | ORAL | 2 refills | Status: DC

## 2021-11-15 MED ORDER — FLUTICASONE PROPIONATE 50 MCG/ACT NA SUSP: 2.0000 | Freq: Every day | NASAL | 2 refills | Status: DC

## 2021-11-15 NOTE — Discharge Instructions (Addendum)
I recommend trying the daily zyrtec medicine with the nasal spray. Try for the next 2-3 days. If symptoms don't improve after 3 days, you can fill the paper prescription for amoxicillin.  Return to urgent care if symptoms persist.

## 2021-11-15 NOTE — ED Provider Notes (Signed)
[facility]    CSN: [id number] Arrival date & time: 11/15/21  1637      History   Chief Complaint Chief Complaint  Patient presents with   Otalgia    HPI Daniel Lloyd is a 56 y.o. male.  Presents with left ear pain and fullness Worsened last night and today, now only 2/10 Was having some headaches over the past few days  Feels like his head is underwater Muffled hearing  Denies hearing loss or drainage from the ear No fevers, no recent URI symptoms No recent flying, diving, or swimming  History of wax impaction  Past Medical History:  Diagnosis Date   Depression    High cholesterol    Substance abuse ([place])    Vitamin D deficiency     Patient Active Problem List   Diagnosis Date Noted   Prediabetes 07/19/2020   Hyperlipidemia 07/19/2020   Impacted cerumen of both ears 07/18/2020   Dry eye syndrome of bilateral lacrimal glands 07/18/2020   Encounter for immunization 07/18/2020   Gastroesophageal reflux disease 07/18/2020   Hematuria 07/18/2020   Myalgia 07/18/2020   Obesity 07/18/2020   Vitamin D deficiency 07/18/2020    Past Surgical History:  Procedure Laterality Date   VASECTOMY N/A    [employer]reesia 06/23/2020   WRIST DEBRIDEMENT         Home Medications    Prior to Admission medications   Medication Sig Start Date End Date Taking? Authorizing Provider  amoxicillin (AMOXIL) 500 MG capsule Take 1 capsule (500 mg total) by mouth 2 (two) times daily for 5 days. 11/15/21 11/20/21 Yes Jasper Hanf, Wells Guiles, PA-C  cetirizine (ZYRTEC ALLERGY) 10 MG tablet Take 1 tablet (10 mg total) by mouth daily. 11/15/21  Yes Nick Stults, Wells Guiles, PA-C  fluticasone (FLONASE) 50 MCG/ACT nasal spray Place 2 sprays into both nostrils daily for 5 days. 11/15/21 11/20/21 Yes Augusten Lipkin, Wells Guiles, PA-C  atorvastatin (LIPITOR) 40 MG tablet Take 1 tablet (40 mg total) by mouth daily. 07/19/20   Camillia Herter, NP  Cholecalciferol (VITAMIN D3) 25 MCG (1000 UT) CAPS Take 1 capsule (1,000  Units total) by mouth daily. 07/19/20   Camillia Herter, NP    Family History Family History  Problem Relation Age of Onset   Hypertension Mother    Cancer Other    Diabetes Other     Social History Social History   Tobacco Use   Smoking status: Former    Types: Cigarettes   Smokeless tobacco: Never  Vaping Use   Vaping Use: Never used  Substance Use Topics   Alcohol use: No   Drug use: No     Allergies   Patient has no known allergies.   Review of Systems Review of Systems  HENT:  Positive for ear pain.    Per HPI  Physical Exam Triage Vital Signs ED Triage Vitals  Enc Vitals Group     BP 11/15/21 1652 130/89     Pulse Rate 11/15/21 1652 83     Resp 11/15/21 1652 18     Temp 11/15/21 1652 97.9 F (36.6 C)     Temp Source 11/15/21 1652 Oral     SpO2 11/15/21 1652 98 %     Weight --      Height --      Head Circumference --      Peak Flow --      Pain Score 11/15/21 1651 2     Pain Loc --  Pain Edu? --      Excl. in GC? --    No data found.  Updated Vital Signs BP 130/89 (BP Location: Right Arm)   Pulse 83   Temp 97.9 F (36.6 C) (Oral)   Resp 18   SpO2 98%   Physical Exam Vitals and nursing note reviewed.  Constitutional:      General: He is not in acute distress. HENT:     Right Ear: Tympanic membrane, ear canal and external ear normal.     Left Ear: External ear normal. Tympanic membrane is bulging.     Ears:     Comments: Clear fluid behind left TM, bulging.    Nose: No congestion.     Mouth/Throat:     Pharynx: Oropharynx is clear.  Eyes:     Conjunctiva/sclera: Conjunctivae normal.  Cardiovascular:     Rate and Rhythm: Normal rate and regular rhythm.     Heart sounds: Normal heart sounds.  Pulmonary:     Effort: Pulmonary effort is normal.     Breath sounds: Normal breath sounds.  Neurological:     Mental Status: He is alert and oriented to person, place, and time.     UC Treatments / Results  Labs (all labs ordered  are listed, but only abnormal results are displayed) Labs Reviewed - No data to display  EKG  Radiology No results found.  Procedures Procedures (including critical care time)  Medications Ordered in UC Medications - No data to display  Initial Impression / Assessment and Plan / UC Course  I have reviewed the triage vital signs and the nursing notes.  Pertinent labs & imaging results that were available during my care of the patient were reviewed by me and considered in my medical decision making (see chart for details).  Left TM with clear fluid, likely viral or allergic although bulging Recommend daily allergy medicine, flonase, try for a few days If no better after 3 days, can fill paper prescription for amox Discussed return precautions, patient agrees to plan  Final Clinical Impressions(s) / UC Diagnoses   Final diagnoses:  Left ear pain     Discharge Instructions      I recommend trying the daily zyrtec medicine with the nasal spray. Try for the next 2-3 days. If symptoms don't improve after 3 days, you can fill the paper prescription for amoxicillin.  Return to urgent care if symptoms persist.     ED Prescriptions     Medication Sig Dispense Auth. Provider   fluticasone (FLONASE) 50 MCG/ACT nasal spray Place 2 sprays into both nostrils daily for 5 days. 9.9 mL Lasharn Bufkin, PA-C   cetirizine (ZYRTEC ALLERGY) 10 MG tablet Take 1 tablet (10 mg total) by mouth daily. 30 tablet Bentli Llorente, PA-C   amoxicillin (AMOXIL) 500 MG capsule Take 1 capsule (500 mg total) by mouth 2 (two) times daily for 5 days. 10 capsule Tanny Harnack, Lurena Joiner, PA-C      PDMP not reviewed this encounter.   Kathrine Haddock 11/15/21 1750

## 2021-11-15 NOTE — ED Triage Notes (Signed)
Pt week had headaches, dental pain and then last night started having bilat ear fullness and pain. Sharp sound makes pain worse in left ear. Sound sounds muffled.

## 2021-12-24 ENCOUNTER — Ambulatory Visit
Admission: EM | Admit: 2021-12-24 | Discharge: 2021-12-24 | Disposition: A | Discharge status: Home / Self Care | Payer: No Typology Code available for payment source | Attending: Emergency Medicine | Admitting: Emergency Medicine

## 2021-12-24 DIAGNOSIS — H6992 Unspecified Eustachian tube disorder, left ear: Secondary | ICD-10-CM | POA: Diagnosis not present

## 2021-12-24 DIAGNOSIS — J989 Respiratory disorder, unspecified: Secondary | ICD-10-CM

## 2021-12-24 DIAGNOSIS — J309 Allergic rhinitis, unspecified: Secondary | ICD-10-CM | POA: Diagnosis not present

## 2021-12-24 DIAGNOSIS — R0602 Shortness of breath: Secondary | ICD-10-CM

## 2021-12-24 DIAGNOSIS — T7840XA Allergy, unspecified, initial encounter: Secondary | ICD-10-CM

## 2021-12-24 DIAGNOSIS — L309 Dermatitis, unspecified: Secondary | ICD-10-CM | POA: Diagnosis not present

## 2021-12-24 MED ORDER — CETIRIZINE HCL 10 MG PO TABS: 10.0000 mg | ORAL_TABLET | Freq: Every day | ORAL | 1 refills | Status: AC

## 2021-12-24 MED ORDER — ALBUTEROL SULFATE HFA 108 (90 BASE) MCG/ACT IN AERS: 2.0000 | INHALATION_SPRAY | Freq: Four times a day (QID) | RESPIRATORY_TRACT | 0 refills | Status: AC | PRN

## 2021-12-24 MED ORDER — TRIAMCINOLONE ACETONIDE 55 MCG/ACT NA AERO: 2.0000 | INHALATION_SPRAY | Freq: Every day | NASAL | 0 refills | Status: AC

## 2021-12-24 MED ORDER — AEROCHAMBER PLUS FLO-VU LARGE MISC: 1.0000 | Freq: Once | 0 refills | Status: AC

## 2021-12-24 MED ORDER — MONTELUKAST SODIUM 10 MG PO TABS: 10.0000 mg | ORAL_TABLET | Freq: Every day | ORAL | 2 refills | Status: AC

## 2021-12-24 MED ORDER — TRIAMCINOLONE ACETONIDE 0.1 % EX CREA: 1.0000 | TOPICAL_CREAM | Freq: Two times a day (BID) | CUTANEOUS | 3 refills | Status: AC

## 2021-12-24 MED ORDER — ALBUTEROL SULFATE (2.5 MG/3ML) 0.083% IN NEBU
2.5000 mg | INHALATION_SOLUTION | Freq: Once | RESPIRATORY_TRACT | Status: AC
  Administered 2021-12-24: 2.5 mg via RESPIRATORY_TRACT

## 2021-12-24 MED ORDER — IPRATROPIUM BROMIDE 0.06 % NA SOLN: 2.0000 | Freq: Three times a day (TID) | NASAL | 1 refills | Status: AC

## 2021-12-24 MED ORDER — METHYLPREDNISOLONE 8 MG PO TABS: 8.0000 mg | ORAL_TABLET | Freq: Every day | ORAL | 0 refills | Status: DC

## 2021-12-24 NOTE — ED Triage Notes (Signed)
Pt reports having left sided ear aching. The patient states his tooth (right side) begins to hurt when ear hurts also.  The patient c/o muscle weakness and episodes of SOB.   Started: about 2 days ago   Home interventions: tylenol

## 2021-12-24 NOTE — Discharge Instructions (Addendum)
For your eczema, please begin cream called triamcinolone by applying it twice daily to all affected areas.  After you apply the cream and you feel that it is well absorbed, please apply a moisture barrier cream such as Eucerin original healing.  I provided you with a picture of this product so you will know which one to buy.  Please read below to learn more about the medications, dosages and frequencies that I recommend to help alleviate your symptoms and to get you feeling better soon: Medrol Dosepak (methylprednisolone): This is a steroid that will significantly calm your upper and lower airways, please take the daily recommended quantity of tablets daily with your breakfast meal starting tomorrow morning until the prescription is complete.    Zyrtec (cetirizine): This is an excellent second-generation antihistamine that helps to reduce respiratory inflammatory response to environmental allergens.  In some patients, this medication can cause daytime sleepiness so I recommend that you take 1 tablet daily at bedtime.     Singulair (montelukast): This is a mast cell stabilizer that works well with antihistamines.  Mast cells are responsible for stimulating histamine production so you can imagine that if we can reduce the activity of your mast cells, then fewer histamines will be produced and inflammation caused by allergy exposure will be significantly reduced.  I recommend that you take this medication at the same time you take your antihistamine.   Nasacort (triamcinolone): This is a steroid nasal spray that you use once daily, 2 sprays in each nare.  This medication does not work well if you decide to use it only used as you feel you need to, it works best used on a daily basis.  After 3 to 5 days of use, you will notice significant reduction of the inflammation and mucus production that is currently being caused by exposure to allergens, whether seasonal or environmental.  The most common side effect of  this medication is nosebleeds.  If you experience a nosebleed, please discontinue use for 1 week, then feel free to resume.  I have provided you with a prescription.    Atrovent (ipratropium): This is an excellent nasal decongestant spray I have added to your recommended nasal steroid that will not cause rebound congestion, please instill 2 sprays into each nare with each use.  Because nasal steroids can take several days before they begin to provide full benefit, I recommend that you use this spray in addition to the nasal steroid prescribed for you.  Please use it after you have used your nasal steroid and repeat up to 4 times daily as needed.  I have provided you with a prescription for this medication.      ProAir, Ventolin, Proventil (albuterol): This inhaled medication contains a short acting beta agonist bronchodilator.  This medication works on the smooth muscle that opens and constricts of your airways by relaxing the muscle.  The result of relaxation of the smooth muscle is increased air movement and improved work of breathing.  This is a short acting medication that can be used every 4-6 hours as needed for increased work of breathing, shortness of breath, wheezing and excessive coughing.  I have provided you with a prescription.    Topical Kenalog (triamcinolone): This is a topical steroid that can be applied to the affected area of your skin twice daily.  Please apply it exactly as directed by your prescription instructions.  Unnecessary exposure to topical steroids can cause bleaching of the skin which can be permanent so  please immediately discontinue use of this topical steroid once your skin condition is resolved.   If you find that your health insurance will not pay for allergy medications, please consider downloading the GoodRx app and using to get a better price than the "off the shelf" price.     Please follow-up within the next 5-7 days either with your primary care provider or urgent  care if your symptoms do not resolve.  If you do not have a primary care provider, we will assist you in finding one.        Thank you for visiting urgent care today.  We appreciate the opportunity to participate in your care.

## 2021-12-24 NOTE — ED Provider Notes (Signed)
[facility]    CSN: [id number] Arrival date & time: 12/24/21  1341    HISTORY   Chief Complaint  Patient presents with   Otalgia   HPI Daniel Lloyd is a pleasant, 56 y.o. male who presents to urgent care today. Patient complains of left-sided earache.  Patient states he also has a tooth on the right side that began to hurt when his left ear began to hurt, states he has a crown on that tooth and knows that it needs to be replaced.  Patient states he is also had some muscle weakness and episodes of shortness of breath.  Patient states this has been going on for a few days and has been taking Tylenol without meaningful relief of his symptoms.  Patient endorses a history of dental caries.  Patient denies hearing loss or drainage from his left ear.  Patient denies fever, aches, chills, headache, nausea, vomiting, diarrhea, dizziness.  The history is provided by the patient.   Past Medical History:  Diagnosis Date   Depression    High cholesterol    Substance abuse (HCC)    Vitamin D deficiency    Patient Active Problem List   Diagnosis Date Noted   Prediabetes 07/19/2020   Hyperlipidemia 07/19/2020   Impacted cerumen of both ears 07/18/2020   Dry eye syndrome of bilateral lacrimal glands 07/18/2020   Encounter for immunization 07/18/2020   Gastroesophageal reflux disease 07/18/2020   Hematuria 07/18/2020   Myalgia 07/18/2020   Obesity 07/18/2020   Vitamin D deficiency 07/18/2020   Past Surgical History:  Procedure Laterality Date   VASECTOMY N/A    [employer] 06/23/2020   WRIST DEBRIDEMENT      Home Medications    Prior to Admission medications   Medication Sig Start Date End Date Taking? Authorizing Provider  cetirizine (ZYRTEC ALLERGY) 10 MG tablet Take 1 tablet (10 mg total) by mouth daily. 11/15/21   Rising, Lurena Joiner, PA-C  Cholecalciferol (VITAMIN D3) 25 MCG (1000 UT) CAPS Take 1 capsule (1,000 Units total) by mouth daily. 07/19/20   Rema Fendt, NP   fluticasone (FLONASE) 50 MCG/ACT nasal spray Place 2 sprays into both nostrils daily for 5 days. 11/15/21 11/20/21  Rising, Lurena Joiner, PA-C    Family History Family History  Problem Relation Age of Onset   Hypertension Mother    Cancer Other    Diabetes Other    Social History Social History   Tobacco Use   Smoking status: Former    Types: Cigarettes   Smokeless tobacco: Never  Vaping Use   Vaping Use: Never used  Substance Use Topics   Alcohol use: No   Drug use: No   Allergies   Patient has no known allergies.  Review of Systems Review of Systems Pertinent findings revealed after performing a 14 point review of systems has been noted in the history of present illness.  Physical Exam Triage Vital Signs ED Triage Vitals  Enc Vitals Group     BP 12/21/20 0827 (!) 147/82     Pulse Rate 12/21/20 0827 72     Resp 12/21/20 0827 18     Temp 12/21/20 0827 98.3 F (36.8 C)     Temp Source 12/21/20 0827 Oral     SpO2 12/21/20 0827 98 %     Weight --      Height --      Head Circumference --      Peak Flow --      Pain Score  12/21/20 0826 5     Pain Loc --      Pain Edu? --      Excl. in GC? --   No data found.  Updated Vital Signs BP (!) 143/99 (BP Location: Left Arm)   Pulse 62   Temp 98.5 F (36.9 C) (Oral)   Resp 18   SpO2 98%   Physical Exam Vitals and nursing note reviewed.  Constitutional:      General: He is not in acute distress.    Appearance: Normal appearance. He is not ill-appearing.  HENT:     Head: Normocephalic and atraumatic.     Salivary Glands: Right salivary gland is not diffusely enlarged or tender. Left salivary gland is not diffusely enlarged or tender.     Right Ear: Ear canal and external ear normal. No drainage. A middle ear effusion is present. There is no impacted cerumen. Tympanic membrane is bulging. Tympanic membrane is not injected or erythematous.     Left Ear: Ear canal and external ear normal. No drainage. A middle ear  effusion is present. There is no impacted cerumen. Tympanic membrane is bulging. Tympanic membrane is not injected or erythematous.     Ears:     Comments: Bilateral EACs normal, both TMs bulging with clear fluid    Nose: Rhinorrhea present. No nasal deformity, septal deviation, signs of injury, nasal tenderness, mucosal edema or congestion. Rhinorrhea is clear.     Right Nostril: Occlusion present. No foreign body, epistaxis or septal hematoma.     Left Nostril: Occlusion present. No foreign body, epistaxis or septal hematoma.     Right Turbinates: Enlarged, swollen and pale.     Left Turbinates: Enlarged, swollen and pale.     Right Sinus: No maxillary sinus tenderness or frontal sinus tenderness.     Left Sinus: No maxillary sinus tenderness or frontal sinus tenderness.     Mouth/Throat:     Lips: Pink. No lesions.     Mouth: Mucous membranes are moist. No oral lesions.     Pharynx: Oropharynx is clear. Uvula midline. No posterior oropharyngeal erythema or uvula swelling.     Tonsils: No tonsillar exudate. 0 on the right. 0 on the left.     Comments: Postnasal drip Eyes:     General: Lids are normal.        Right eye: No discharge.        Left eye: No discharge.     Extraocular Movements: Extraocular movements intact.     Conjunctiva/sclera: Conjunctivae normal.     Right eye: Right conjunctiva is not injected.     Left eye: Left conjunctiva is not injected.  Neck:     Trachea: Trachea and phonation normal.  Cardiovascular:     Rate and Rhythm: Normal rate and regular rhythm.     Pulses: Normal pulses.     Heart sounds: Normal heart sounds. No murmur heard.    No friction rub. No gallop.  Pulmonary:     Effort: Pulmonary effort is normal. No accessory muscle usage, prolonged expiration or respiratory distress.     Breath sounds: No stridor, decreased air movement or transmitted upper airway sounds. Examination of the right-upper field reveals wheezing. Examination of the  left-upper field reveals wheezing. Examination of the right-middle field reveals wheezing. Examination of the left-middle field reveals wheezing. Examination of the right-lower field reveals wheezing. Examination of the left-lower field reveals wheezing. Wheezing present. No decreased breath sounds, rhonchi or rales.  Chest:  Chest wall: No tenderness.  Musculoskeletal:        General: Normal range of motion.     Cervical back: Normal range of motion and neck supple. Normal range of motion.  Lymphadenopathy:     Cervical: No cervical adenopathy.  Skin:    General: Skin is warm and dry.     Findings: Rash (Eczematous rash bilateral upper arms) present. No erythema.  Neurological:     General: No focal deficit present.     Mental Status: He is alert and oriented to person, place, and time.  Psychiatric:        Mood and Affect: Mood normal.        Behavior: Behavior normal.     Visual Acuity Right Eye Distance:   Left Eye Distance:   Bilateral Distance:    Right Eye Near:   Left Eye Near:    Bilateral Near:     UC Couse / Diagnostics / Procedures:     Radiology No results found.  Procedures Procedures (including critical care time) EKG  Pending results:  Labs Reviewed - No data to display  Medications Ordered in UC: Medications  albuterol (PROVENTIL) (2.5 MG/3ML) 0.083% nebulizer solution 2.5 mg (2.5 mg Nebulization Given 12/24/21 1622)    UC Diagnoses / Final Clinical Impressions(s)   I have reviewed the triage vital signs and the nursing notes.  Pertinent labs & imaging results that were available during my care of the patient were reviewed by me and considered in my medical decision making (see chart for details).    Final diagnoses:  Allergic rhinitis, unspecified seasonality, unspecified trigger  Dysfunction of left eustachian tube  Eczema, unspecified type  Shortness of breath  Allergic disorder of respiratory tract   Breath sounds were significantly  improved after nebulized bronchodilator.  Patient also reported improved work of breathing.  Patient provided with albuterol to use at home.  Patient provided with a 3-day course of methylprednisolone calm respiratory inflammation as well as his eczema.  Patient provided with cetirizine, Singulair, Nasacort, ipratropium nasal spray for relief of upper and lower respiratory inflammation and allergies, patient states that he has tried Flonase in the past and it was more irritating than helpful.  Provided with a steroid cream for relief of eczema.  Return precautions advised.  ED Prescriptions     Medication Sig Dispense Auth. Provider   cetirizine (ZYRTEC ALLERGY) 10 MG tablet Take 1 tablet (10 mg total) by mouth at bedtime. 90 tablet Theadora RamaMorgan, Marcellis Frampton Scales, PA-C   ipratropium (ATROVENT) 0.06 % nasal spray Place 2 sprays into both nostrils 3 (three) times daily. As needed for nasal congestion, runny nose 15 mL Theadora RamaMorgan, Leane Loring Scales, PA-C   triamcinolone cream (KENALOG) 0.1 % Apply 1 Application topically 2 (two) times daily. Apply to affected area(s) twice daily , do not apply to face. 80 g Theadora RamaMorgan, Shakevia Sarris Scales, PA-C   triamcinolone (NASACORT) 55 MCG/ACT AERO nasal inhaler Place 2 sprays into the nose daily. 1 each Theadora RamaMorgan, Florencia Zaccaro Scales, PA-C   methylPREDNISolone (MEDROL) 8 MG tablet Take 1 tablet (8 mg total) by mouth daily for 3 days. 3 tablet Theadora RamaMorgan, Debbe Crumble Scales, PA-C   montelukast (SINGULAIR) 10 MG tablet Take 1 tablet (10 mg total) by mouth at bedtime. 30 tablet Theadora RamaMorgan, Yashar Inclan Scales, PA-C   albuterol (VENTOLIN HFA) 108 (90 Base) MCG/ACT inhaler Inhale 2 puffs into the lungs every 6 (six) hours as needed for wheezing or shortness of breath (Cough). 18 g Theadora RamaMorgan, Kenitra Leventhal Scales, [place]  Spacer/Aero-Holding Chambers (AEROCHAMBER PLUS FLO-VU LARGE) MISC 1 each by Other route once for 1 dose. 1 each Theadora Rama Scales, PA-C      PDMP not reviewed this encounter.  Disposition Upon  Discharge:  Condition: stable for discharge home Home: take medications as prescribed; routine discharge instructions as discussed; follow up as advised.  Patient presented with an acute illness with associated systemic symptoms and significant discomfort requiring urgent management. In my opinion, this is a condition that a prudent lay person (someone who possesses an average knowledge of health and medicine) may potentially expect to result in complications if not addressed urgently such as respiratory distress, impairment of bodily function or dysfunction of bodily organs.   Routine symptom specific, illness specific and/or disease specific instructions were discussed with the patient and/or caregiver at length.   As such, the patient has been evaluated and assessed, work-up was performed and treatment was provided in alignment with urgent care protocols and evidence based medicine.  Patient/parent/caregiver has been advised that the patient may require follow up for further testing and treatment if the symptoms continue in spite of treatment, as clinically indicated and appropriate.  If the patient was tested for COVID-19, Influenza and/or RSV, then the patient/parent/guardian was advised to isolate at home pending the results of his/her diagnostic coronavirus test and potentially longer if they're positive. I have also advised pt that if his/her COVID-19 test returns positive, it's recommended to self-isolate for at least 10 days after symptoms first appeared AND until fever-free for 24 hours without fever reducer AND other symptoms have improved or resolved. Discussed self-isolation recommendations as well as instructions for household member/close contacts as per the [facility] and  DHHS, and also gave patient the COVID packet with this information.  Patient/parent/caregiver has been advised to return to the [facility] or PCP in 3-5 days if no better; to PCP or the Emergency Department if new signs and  symptoms develop, or if the current signs or symptoms continue to change or worsen for further workup, evaluation and treatment as clinically indicated and appropriate  The patient will follow up with their current PCP if and as advised. If the patient does not currently have a PCP we will assist them in obtaining one.   The patient may need specialty follow up if the symptoms continue, in spite of conservative treatment and management, for further workup, evaluation, consultation and treatment as clinically indicated and appropriate.  Patient/parent/caregiver verbalized understanding and agreement of plan as discussed.  All questions were addressed during visit.  Please see discharge instructions below for further details of plan.  Discharge Instructions:   Discharge Instructions      For your eczema, please begin cream called triamcinolone by applying it twice daily to all affected areas.  After you apply the cream and you feel that it is well absorbed, please apply a moisture barrier cream such as Eucerin original healing.  I provided you with a picture of this product so you will know which one to buy.  Please read below to learn more about the medications, dosages and frequencies that I recommend to help alleviate your symptoms and to get you feeling better soon: Medrol Dosepak (methylprednisolone): This is a steroid that will significantly calm your upper and lower airways, please take the daily recommended quantity of tablets daily with your breakfast meal starting tomorrow morning until the prescription is complete.    Zyrtec (cetirizine): This is an excellent second-generation antihistamine that helps to reduce respiratory inflammatory response  to environmental allergens.  In some patients, this medication can cause daytime sleepiness so I recommend that you take 1 tablet daily at bedtime.     Singulair (montelukast): This is a mast cell stabilizer that works well with antihistamines.   Mast cells are responsible for stimulating histamine production so you can imagine that if we can reduce the activity of your mast cells, then fewer histamines will be produced and inflammation caused by allergy exposure will be significantly reduced.  I recommend that you take this medication at the same time you take your antihistamine.   Nasacort (triamcinolone): This is a steroid nasal spray that you use once daily, 2 sprays in each nare.  This medication does not work well if you decide to use it only used as you feel you need to, it works best used on a daily basis.  After 3 to 5 days of use, you will notice significant reduction of the inflammation and mucus production that is currently being caused by exposure to allergens, whether seasonal or environmental.  The most common side effect of this medication is nosebleeds.  If you experience a nosebleed, please discontinue use for 1 week, then feel free to resume.  I have provided you with a prescription.    Atrovent (ipratropium): This is an excellent nasal decongestant spray I have added to your recommended nasal steroid that will not cause rebound congestion, please instill 2 sprays into each nare with each use.  Because nasal steroids can take several days before they begin to provide full benefit, I recommend that you use this spray in addition to the nasal steroid prescribed for you.  Please use it after you have used your nasal steroid and repeat up to 4 times daily as needed.  I have provided you with a prescription for this medication.      ProAir, Ventolin, Proventil (albuterol): This inhaled medication contains a short acting beta agonist bronchodilator.  This medication works on the smooth muscle that opens and constricts of your airways by relaxing the muscle.  The result of relaxation of the smooth muscle is increased air movement and improved work of breathing.  This is a short acting medication that can be used every 4-6 hours as needed  for increased work of breathing, shortness of breath, wheezing and excessive coughing.  I have provided you with a prescription.    Topical Kenalog (triamcinolone): This is a topical steroid that can be applied to the affected area of your skin twice daily.  Please apply it exactly as directed by your prescription instructions.  Unnecessary exposure to topical steroids can cause bleaching of the skin which can be permanent so please immediately discontinue use of this topical steroid once your skin condition is resolved.   If you find that your health insurance will not pay for allergy medications, please consider downloading the GoodRx app and using to get a better price than the "off the shelf" price.     Please follow-up within the next 5-7 days either with your primary care provider or urgent care if your symptoms do not resolve.  If you do not have a primary care provider, we will assist you in finding one.        Thank you for visiting urgent care today.  We appreciate the opportunity to participate in your care.         This office note has been dictated using Teaching laboratory technician.  Unfortunately, this method of dictation can sometimes  lead to typographical or grammatical errors.  I apologize for your inconvenience in advance if this occurs.  Please do not hesitate to reach out to me if clarification is needed.      Lynden Oxford Scales, PA-C 12/27/21 1029

## 2021-12-27 ENCOUNTER — Encounter: Payer: Self-pay | Admitting: Family

## 2021-12-27 ENCOUNTER — Ambulatory Visit: Payer: No Typology Code available for payment source | Admitting: Family

## 2021-12-27 VITALS — BP 129/84 | HR 70 | Temp 98.2°F | Ht 69.0 in | Wt 261.8 lb

## 2021-12-27 DIAGNOSIS — E785 Hyperlipidemia, unspecified: Secondary | ICD-10-CM

## 2021-12-27 DIAGNOSIS — Z23 Encounter for immunization: Secondary | ICD-10-CM | POA: Diagnosis not present

## 2021-12-27 DIAGNOSIS — E559 Vitamin D deficiency, unspecified: Secondary | ICD-10-CM | POA: Diagnosis not present

## 2021-12-27 DIAGNOSIS — R7303 Prediabetes: Secondary | ICD-10-CM | POA: Diagnosis not present

## 2021-12-27 NOTE — Patient Instructions (Addendum)
Welcome to [facility], It was a pleasure meeting you today!    I will review your lab results via MyChart in a 2-3 days.   Please schedule a 1 month follow up visit today.    PLEASE NOTE: If you had any LAB tests please let us know if you have not heard back within a few days. You may see your results on MyChart before we have a chance to review them but we will give you a call once they are reviewed by [place]. If we ordered any REFERRALS today, please let us know if you have not heard from their office within the next week.  Let us know through MyChart if you are needing REFILLS, or have your pharmacy send [place] the request. You can also use MyChart to communicate with me or any office staff.

## 2021-12-27 NOTE — Assessment & Plan Note (Signed)
chronic reports taking Lipitor in past but caused leg pain will recheck labs today - non-fasting discussed restarting low dose med and just 3d/week, pt agreeable f/u in 26mos

## 2021-12-27 NOTE — Progress Notes (Signed)
New Patient Office Visit  Subjective:  Patient ID: Daniel Lloyd, male    DOB: [birth year]  Age: 56 y.o. MRN: [id number]  CC:  Chief Complaint  Patient presents with   Establish Care   pre diabetic    HPI Daniel Lloyd presents for establishing care today.  Prediabetes:   pt reports his last A1C 5.9, lost 45lbs and got down to 5.3, but has regained some of the weight back. pt works used to work 3rd shift but now is working 2nd shift. His mother who has dementia has moved in with he & wife and they feed her what she wants to keep from getting irritated, so his weight has increased. Has not had time to exercise like he used to and denies ever taking Metformin or other med in past for diabetes. He reports he did take Lipitor but it caused stabbing pains in his legs.  Hyperlipidemia: Patient is currently maintained on the following medication for hyperlipidemia: none, used to take Lipitor, caused myalgia in legs.  Patient reports fair compliance with low fat/low cholesterol diet.  Last lipid panel as follows: Lab Results  Component Value Date   CHOL 179 07/18/2020   HDL 39 (L) 07/18/2020   LDLCALC 132 (H) 07/18/2020   TRIG 40 07/18/2020   CHOLHDL 4.6 07/18/2020    Assessment & Plan:   Problem List Items Addressed This Visit       Other   Vitamin D deficiency    chronic has taken RX in past, then switched to OTC, but stopped about 8 mos ago rechecking level today f/u in 6 mos      Relevant Orders   Vitamin D (25 hydroxy)   Prediabetes    chronic pt not seen in over a year - has been taking care of mom who lives w/he & wife & has dementia has gained weight and is concerned his A1C has gone back up checking all labs today, non-fasting  pt reports very low readings at home, lowest 48, can feel this so he checks CBG discussed possibly starting Metformin, based on lab work      Relevant Orders   Lipid panel   Comp Met (CMET)   HgB A1c   Hyperlipidemia     chronic reports taking Lipitor in past but caused leg pain will recheck labs today - non-fasting discussed restarting low dose med and just 3d/week, pt agreeable f/u in 55mo      Other Visit Diagnoses     Need for immunization against influenza    -  Primary   Relevant Orders   Flu Vaccine QUAD 633moM (Fluarix, Fluzone & Alfiuria Quad PF) (Completed)       Subjective:    Outpatient Medications Prior to Visit  Medication Sig Dispense Refill   albuterol (VENTOLIN HFA) 108 (90 Base) MCG/ACT inhaler Inhale 2 puffs into the lungs every 6 (six) hours as needed for wheezing or shortness of breath (Cough). 18 g 0   cetirizine (ZYRTEC ALLERGY) 10 MG tablet Take 1 tablet (10 mg total) by mouth at bedtime. 90 tablet 1   ipratropium (ATROVENT) 0.06 % nasal spray Place 2 sprays into both nostrils 3 (three) times daily. As needed for nasal congestion, runny nose 15 mL 1   montelukast (SINGULAIR) 10 MG tablet Take 1 tablet (10 mg total) by mouth at bedtime. 30 tablet 2   triamcinolone (NASACORT) 55 MCG/ACT AERO nasal inhaler Place 2 sprays into the nose daily. 1 each 0  triamcinolone cream (KENALOG) 0.1 % Apply 1 Application topically 2 (two) times daily. Apply to affected area(s) twice daily , do not apply to face. 80 g 3   methylPREDNISolone (MEDROL) 8 MG tablet Take 1 tablet (8 mg total) by mouth daily for 3 days. 3 tablet 0   Cholecalciferol (VITAMIN D3) 25 MCG (1000 UT) CAPS Take 1 capsule (1,000 Units total) by mouth daily. (Patient not taking: Reported on 12/27/2021) 120 capsule 0   No facility-administered medications prior to visit.   Past Medical History:  Diagnosis Date   Allergy    Depression    Dry eye syndrome of bilateral lacrimal glands 07/18/2020   Encounter for immunization 07/18/2020   GERD (gastroesophageal reflux disease)    Hematuria 07/18/2020   High cholesterol    Impacted cerumen of both ears 07/18/2020   Substance abuse ([facility])    Vitamin D deficiency    Past  Surgical History:  Procedure Laterality Date   VASECTOMY N/A    [employer] 06/23/2020   WRIST DEBRIDEMENT      Objective:   Today's Vitals: BP 129/84   Pulse 70   Temp 98.2 F (36.8 C) (Temporal)   Ht _0  (1.753 m)   Wt 261 lb 12.8 oz (118.8 kg)   SpO2 99%   BMI 38.66 kg/m   Physical Exam Vitals and nursing note reviewed.  Constitutional:      General: He is not in acute distress.    Appearance: Normal appearance.  HENT:     Head: Normocephalic.  Cardiovascular:     Rate and Rhythm: Normal rate and regular rhythm.  Pulmonary:     Effort: Pulmonary effort is normal.     Breath sounds: Normal breath sounds.  Musculoskeletal:        General: Normal range of motion.     Cervical back: Normal range of motion.  Skin:    General: Skin is warm and dry.  Neurological:     Mental Status: He is alert and oriented to person, place, and time.  Psychiatric:        Mood and Affect: Mood normal.     Jeanie Sewer, NP

## 2021-12-27 NOTE — Assessment & Plan Note (Signed)
chronic has taken RX in past, then switched to OTC, but stopped about 8 mos ago rechecking level today f/u in 6 mos

## 2021-12-27 NOTE — Assessment & Plan Note (Addendum)
chronic pt not seen in over a year - has been taking care of mom who lives w/he & wife & has dementia has gained weight and is concerned his A1C has gone back up checking all labs today, non-fasting  pt reports very low readings at home, lowest 48, can feel this so he checks CBG discussed possibly starting Metformin, based on lab work

## 2021-12-28 LAB — COMPREHENSIVE METABOLIC PANEL
AG Ratio: 1.2 (calc) (ref 1.0–2.5)
ALT: 20 U/L (ref 9–46)
AST: 12 U/L (ref 10–35)
Albumin: 4.1 g/dL (ref 3.6–5.1)
Alkaline phosphatase (APISO): 34 U/L — ABNORMAL LOW (ref 35–144)
BUN: 17 mg/dL (ref 7–25)
CO2: 25 mmol/L (ref 20–32)
Calcium: 9.2 mg/dL (ref 8.6–10.3)
Chloride: 106 mmol/L (ref 98–110)
Creat: 1.28 mg/dL (ref 0.70–1.30)
Globulin: 3.3 g/dL (calc) (ref 1.9–3.7)
Glucose, Bld: 83 mg/dL (ref 65–99)
Potassium: 4.3 mmol/L (ref 3.5–5.3)
Sodium: 140 mmol/L (ref 135–146)
Total Bilirubin: 0.4 mg/dL (ref 0.2–1.2)
Total Protein: 7.4 g/dL (ref 6.1–8.1)

## 2021-12-28 LAB — LIPID PANEL
Cholesterol: 199 mg/dL (ref <200)
HDL: 43 mg/dL (ref >=40)
LDL Cholesterol (Calc): 141 mg/dL (calc) — ABNORMAL HIGH
Non-HDL Cholesterol (Calc): 156 mg/dL (calc) — ABNORMAL HIGH (ref <130)
Total CHOL/HDL Ratio: 4.6 (calc) (ref <5.0)
Triglycerides: 56 mg/dL (ref <150)

## 2021-12-28 LAB — HEMOGLOBIN A1C
Hgb A1c MFr Bld: 6 % of total Hgb — ABNORMAL HIGH (ref <5.7)
Mean Plasma Glucose: 126 mg/dL
eAG (mmol/L): 7 mmol/L

## 2021-12-28 LAB — VITAMIN D 25 HYDROXY (VIT D DEFICIENCY, FRACTURES): Vit D, 25-Hydroxy: 32 ng/mL (ref 30–100)

## 2021-12-31 MED ORDER — ROSUVASTATIN CALCIUM 5 MG PO TABS: 5.0000 mg | ORAL_TABLET | ORAL | 1 refills | Status: DC

## 2021-12-31 NOTE — Addendum Note (Signed)
Addended byJeanie Sewer on: 12/31/2021 10:22 PM   Modules accepted: Orders

## 2021-12-31 NOTE — Progress Notes (Signed)
Your glucose, electrolytes,  liver & kidney function are all normal. Your A1C is still in the prediabetes range, be sure to reduce any white carbs: crackers/chips, biscuits, bread, white rice & pasta & eat whole grain substitutes, but very limited. Also avoid any sweets, 2-3 servings of fruit daily is ok.  Your cholesterol numbers look good, just your non-HDL & LDL (bad #) are high. Need to reduce any fried foods, alcohol, nonnutritional snacks e.g. chips/cookies,pies, cakes and candies, fatty meat (red meat), high fat dairy foods:  including cheese, milk, ice cream.  Increase fruits/vegetables/fiber.   Continue or restart an exercise routine, shooting for 60min 5-7days per week.   I also recommend restarting the cholesterol medication, but as I said, taking it just 3 days per week is usually tolerated well and can still help bring your LDL number down. I have sent this to your pharmacy. Your Vitamin D level is normal but on the low end, so recommend you restart taking 2,000 units of a Vitamin D3 supplement over the counter.  Any questions, let me know!

## 2022-01-24 ENCOUNTER — Encounter: Payer: Self-pay | Admitting: Family

## 2022-01-24 ENCOUNTER — Ambulatory Visit: Payer: No Typology Code available for payment source | Admitting: Family

## 2022-01-24 VITALS — BP 115/83 | HR 68 | Temp 97.6°F | Ht 69.0 in | Wt 267.2 lb

## 2022-01-24 DIAGNOSIS — R7303 Prediabetes: Secondary | ICD-10-CM

## 2022-01-24 DIAGNOSIS — M791 Myalgia, unspecified site: Secondary | ICD-10-CM | POA: Diagnosis not present

## 2022-01-24 DIAGNOSIS — E782 Mixed hyperlipidemia: Secondary | ICD-10-CM

## 2022-01-24 DIAGNOSIS — E785 Hyperlipidemia, unspecified: Secondary | ICD-10-CM

## 2022-01-24 MED ORDER — ROSUVASTATIN CALCIUM 5 MG PO TABS: 5.0000 mg | ORAL_TABLET | ORAL | 1 refills | Status: DC

## 2022-01-24 MED ORDER — METFORMIN HCL ER 500 MG PO TB24: 500.0000 mg | ORAL_TABLET | Freq: Every day | ORAL | 1 refills | Status: DC

## 2022-01-24 NOTE — Assessment & Plan Note (Addendum)
chronic last visit pt reported he had gained weight, concerned his A1C has gone back up, checked and 6.0. weight has gone up again 6lbs advised on nutrition consult or wt loss clinic referral, pt choosing to wait for now starting Metformin ER 500mg  qd f/u 3 mos

## 2022-01-24 NOTE — Progress Notes (Signed)
Patient ID: Daniel Lloyd, male    DOB: 06/09/1965, 56 y.o.   MRN: [id number]  Chief Complaint  Patient presents with   Diabetes    Follow up    Muscle Pain    Pt states he is still having muscle pan in arms. Pt states when he exercises it takes him weeks to recover.     HPI: Hyperlipidemia: Patient is currently maintained on the following medication for hyperlipidemia: Crestor 5mg  qd. Patient denies side effects, having myalgia but states he does not think r/t the statin, feels different. Patient reports good compliance with low fat/low cholesterol diet.  Last lipid panel as follows: Lab Results  Component Value Date   CHOL 199 12/27/2021   HDL 43 12/27/2021   LDLCALC 141 (H) 12/27/2021   TRIG 56 12/27/2021   CHOLHDL 4.6 12/27/2021   Prediabetes:   pt reported last visit his last A1C 5.9, lost 45lbs and got down to 5.3, but has regained some of the weight back. pt works used to work 3rd shift but now is working 2nd shift. His mother who has dementia has moved in with he & wife and they feed her what she wants to keep from getting irritated, so his weight has increased. Has not had time to exercise like he used to and denies ever taking Metformin or other med in past for diabetes.  Muscle pain:  reports having a longer time with recovering from any exercise or lifting heavy things. He does not think it is related to restarting statin. Does not work out or 13/04/2021.   Assessment & Plan:   Problem List Items Addressed This Visit       Other   Prediabetes - Primary    chronic last visit pt reported he had gained weight, concerned his A1C has gone back up, checked and 6.0. weight has gone up again 6lbs advised on nutrition consult or wt loss clinic referral, pt choosing to wait for now starting Metformin ER 500mg  qd f/u 3 mos      Relevant Medications   metFORMIN (GLUCOPHAGE-XR) 500 MG 24 hr tablet   Hyperlipidemia    chronic reports taking Lipitor in past but caused  leg pain LDL 146 last visit - started Crestor 3d/week, pt tolerated and started taking qd today having myalgia, but states it was going on prior to starting Crestor advised to just take Crestor qod for now, checking labs on myalgia f/u in 3 mos and recheck fasting labs      Relevant Medications   rosuvastatin (CRESTOR) 5 MG tablet   Other Visit Diagnoses     Myalgia    - does not think related to statin as it occurs after exerting his muscles, mostly in arms, and will eventually goa away but taking up to a month or longer sometimes. He is concerned there may be some autoimmune condition causing this. Checking autoimmune panel today.    Relevant Orders   Sedimentation rate   Rheumatoid Arthritis Profile   CK (Creatine Kinase)   C-reactive protein   Autoimmune Profile   ANA, IFA Comprehensive Panel    Subjective:    Outpatient Medications Prior to Visit  Medication Sig Dispense Refill   albuterol (VENTOLIN HFA) 108 (90 Base) MCG/ACT inhaler Inhale 2 puffs into the lungs every 6 (six) hours as needed for wheezing or shortness of breath (Cough). 18 g 0   cetirizine (ZYRTEC ALLERGY) 10 MG tablet Take 1 tablet (10 mg total) by mouth at  bedtime. 90 tablet 1   Cholecalciferol (VITAMIN D3) 25 MCG (1000 UT) CAPS Take 1 capsule (1,000 Units total) by mouth daily. 120 capsule 0   ipratropium (ATROVENT) 0.06 % nasal spray Place 2 sprays into both nostrils 3 (three) times daily. As needed for nasal congestion, runny nose 15 mL 1   montelukast (SINGULAIR) 10 MG tablet Take 1 tablet (10 mg total) by mouth at bedtime. 30 tablet 2   triamcinolone (NASACORT) 55 MCG/ACT AERO nasal inhaler Place 2 sprays into the nose daily. 1 each 0   triamcinolone cream (KENALOG) 0.1 % Apply 1 Application topically 2 (two) times daily. Apply to affected area(s) twice daily , do not apply to face. 80 g 3   rosuvastatin (CRESTOR) 5 MG tablet Take 1 tablet (5 mg total) by mouth 3 (three) times a week. Monday, Wednesday,  Friday. 39 tablet 1   No facility-administered medications prior to visit.   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  Constitutional:      General: He is not in acute distress.    Appearance: Normal appearance.  HENT:     Head: Normocephalic.  Cardiovascular:     Rate and Rhythm: Normal rate and regular rhythm.  Pulmonary:     Effort: Pulmonary effort is normal.     Breath sounds: Normal breath sounds.  Musculoskeletal:        General: Normal range of motion.     Cervical back: Normal range of motion.  Skin:    General: Skin is warm and dry.  Neurological:     Mental Status: He is alert and oriented to person, place, and time.  Psychiatric:        Mood and Affect: Mood normal.    BP 115/83 (BP Location: Left Arm, Patient Position: Sitting, Cuff Size: Large)   Pulse 68   Temp 97.6 F (36.4 C) (Temporal)   Ht 5\' 9"  (1.753 m)   Wt 267 lb 4 oz (121.2 kg)   SpO2 98%   BMI 39.47 kg/m  Wt Readings from Last 3 Encounters:  01/24/22 267 lb 4 oz (121.2 kg)  12/27/21 261 lb 12.8 oz (118.8 kg)  07/18/20 250 lb 12.8 oz (113.8 kg)       07/20/20, NP

## 2022-01-24 NOTE — Assessment & Plan Note (Signed)
chronic reports taking Lipitor in past but caused leg pain LDL 146 last visit - started Crestor 3d/week, pt tolerated and started taking qd today having myalgia, but states it was going on prior to starting Crestor advised to just take Crestor qod for now, checking labs on myalgia f/u in 3 mos and recheck fasting labs

## 2022-01-25 LAB — ANA, IFA COMPREHENSIVE PANEL
Anti Nuclear Antibody (ANA): NEGATIVE
ENA SM Ab Ser-aCnc: <1 AI
SM/RNP: <1 AI
SSA (Ro) (ENA) Antibody, IgG: <1 AI
SSB (La) (ENA) Antibody, IgG: <1 AI
Scleroderma (Scl-70) (ENA) Antibody, IgG: <1 AI
ds DNA Ab: <1 IU/mL

## 2022-01-25 LAB — AUTOIMMUNE PROFILE
Anti Nuclear Antibody (ANA): NEGATIVE
Complement C3, Serum: 155 mg/dL (ref 82–167)
dsDNA Ab: <1 IU/mL (ref 0–9)

## 2022-01-25 LAB — C-REACTIVE PROTEIN: CRP: 1.3 mg/L (ref <8.0)

## 2022-01-25 LAB — RHEUMATOID ARTHRITIS PROFILE
Cyclic Citrullin Peptide Ab: 5 units (ref 0–19)
Rheumatoid fact SerPl-aCnc: <10 IU/mL (ref <14.0)

## 2022-01-25 LAB — CK: Total CK: 138 U/L (ref 44–196)

## 2022-01-25 LAB — SEDIMENTATION RATE: Sed Rate: 11 mm/h (ref 0–20)

## 2022-01-26 NOTE — Progress Notes (Signed)
Good news! All of your autoimmune testing is negative for Rheumatoid arthritis, Lupus and any other connective tissue disorder that may be contributing to your symptoms.  As discussed, I recommend starting a consistent exercise routine including cardio and weight lifting to build muscle strength, starting with low weights, less repetitions, and building slowly. Let me know if you want a referral to physical therapy.    It appears you have not had a colonoscopy done yet for colon cancer screening, an order was placed by another provider last year.  Let me know if you would like me to resend this referral!

## 2022-08-08 ENCOUNTER — Telehealth: Payer: Self-pay | Admitting: Family

## 2022-08-08 NOTE — Telephone Encounter (Signed)
Prescription Request  08/08/2022  LOV: 01/24/2022  What is the name of the medication or equipment? metFORMIN (GLUCOPHAGE-XR) 500 MG 24 hr tablet  AND  rosuvastatin (CRESTOR) 5 MG tablet  Have you contacted your pharmacy to request a refill? Yes   Which pharmacy would you like this sent to?  [facility] [address], [place] - [address] [place] [address] Phone: [phone] Fax: [phone]    Patient notified that their request is being sent to the clinical staff for review and that they should receive a response within 2 business days.   Please advise at Mobile [phone] (mobile)

## 2022-08-11 NOTE — Telephone Encounter (Signed)
Patient is scheduled for OV 08/12/22

## 2022-08-12 ENCOUNTER — Ambulatory Visit (INDEPENDENT_AMBULATORY_CARE_PROVIDER_SITE_OTHER): Payer: Self-pay | Admitting: Family

## 2022-08-12 ENCOUNTER — Encounter: Payer: Self-pay | Admitting: Family

## 2022-08-12 VITALS — BP 131/88 | HR 70 | Temp 97.5°F | Ht 69.0 in | Wt 244.1 lb

## 2022-08-12 DIAGNOSIS — E782 Mixed hyperlipidemia: Secondary | ICD-10-CM

## 2022-08-12 DIAGNOSIS — R7303 Prediabetes: Secondary | ICD-10-CM

## 2022-08-12 LAB — LIPID PANEL
Cholesterol: 148 mg/dL (ref 0–200)
HDL: 36.9 mg/dL — ABNORMAL LOW (ref >39.00)
LDL Cholesterol: 100 mg/dL — ABNORMAL HIGH (ref 0–99)
NonHDL: 110.82
Total CHOL/HDL Ratio: 4
Triglycerides: 52 mg/dL (ref 0.0–149.0)
VLDL: 10.4 mg/dL (ref 0.0–40.0)

## 2022-08-12 LAB — HEMOGLOBIN A1C: Hgb A1c MFr Bld: 5.5 % (ref 4.6–6.5)

## 2022-08-12 NOTE — Assessment & Plan Note (Addendum)
chronic A1C up to 6.0  started Metformin 500mg   ER every day, tolerating has changed his diet, exercising more, no longer working 3rd shift & has lost 20lbs continue to encourage wt loss, continued progress rechecking A1C today f/u 6 mos

## 2022-08-12 NOTE — Progress Notes (Signed)
Patient ID: Daniel Lloyd, male    DOB: [birth year], 57 y.o.   MRN: [id number]  Chief Complaint  Patient presents with   Diabetes    Fasting w/ labs   Hyperlipidemia    fasting    HPI: Hyperlipidemia: Patient is currently maintained on the following medication for hyperlipidemia: Crestor 5mg  qod. Patient denies side effects, reports the myalgia he was having before is much improved, he just has some in his right upper bicep that he had from an injury last year. Patient reports good compliance with low fat/low cholesterol diet.   Last lipid panel as follows: Lab Results  Component Value Date   CHOL 199 12/27/2021   HDL 43 12/27/2021   LDLCALC 141 (H) 12/27/2021   TRIG 56 12/27/2021   CHOLHDL 4.6 12/27/2021   Prediabetes:   pt reported last visit his last A1C 5.9, lost 45lbs and got down to 5.3, but had regained some of the weight back. A1C in office was 6.0. pt  used to work 3rd shift but has since stopped and working during the day now. His mother has dementia & had moved in with he & wife and they fed her what she wanted to keep her from getting irritated. But since last visit she is now living with pt sister. Pt states he had not had time to exercise, but able to walk most days now. Weight is down 20lbs. Started Metformin 500mg  ER last visit. Pt tolerating.  Assessment & Plan:  Prediabetes Assessment & Plan: chronic A1C up to 6.0  started Metformin 500mg   ER every day, tolerating has changed his diet, exercising more, no longer working 3rd shift & has lost 20lbs continue to encourage wt loss, continued progress rechecking A1C today f/u 6 mos  Orders: -     Hemoglobin A1c  Mixed hyperlipidemia Assessment & Plan: chronic reports taking Lipitor in past but caused leg pain LDL 146 last visit - started Crestor 3d/week pt tolerated and started taking qod rechecking lipids today, pt has lost 20lbs & changed his diet f/u in 6 mos - 24yr  Orders: -     Lipid  panel   Subjective:    Outpatient Medications Prior to Visit  Medication Sig Dispense Refill   Cholecalciferol (VITAMIN D3) 25 MCG (1000 UT) CAPS Take 1 capsule (1,000 Units total) by mouth daily. 120 capsule 0   ipratropium (ATROVENT) 0.06 % nasal spray Place 2 sprays into both nostrils 3 (three) times daily. As needed for nasal congestion, runny nose 15 mL 1   metFORMIN (GLUCOPHAGE-XR) 500 MG 24 hr tablet Take 1 tablet (500 mg total) by mouth daily with breakfast. 90 tablet 1   rosuvastatin (CRESTOR) 5 MG tablet Take 1 tablet (5 mg total) by mouth every other day. 45 tablet 1   triamcinolone (NASACORT) 55 MCG/ACT AERO nasal inhaler Place 2 sprays into the nose daily. 1 each 0   triamcinolone cream (KENALOG) 0.1 % Apply 1 Application topically 2 (two) times daily. Apply to affected area(s) twice daily , do not apply to face. 80 g 3   albuterol (VENTOLIN HFA) 108 (90 Base) MCG/ACT inhaler Inhale 2 puffs into the lungs every 6 (six) hours as needed for wheezing or shortness of breath (Cough). (Patient not taking: Reported on 08/12/2022) 18 g 0   cetirizine (ZYRTEC ALLERGY) 10 MG tablet Take 1 tablet (10 mg total) by mouth at bedtime. 90 tablet 1   montelukast (SINGULAIR) 10 MG tablet Take 1 tablet (10 mg total)  by mouth at bedtime. 30 tablet 2   No facility-administered medications prior to visit.   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  Constitutional:      General: He is not in acute distress.    Appearance: Normal appearance.  HENT:     Head: Normocephalic.  Cardiovascular:     Rate and Rhythm: Normal rate and regular rhythm.  Pulmonary:     Effort: Pulmonary effort is normal.     Breath sounds: Normal breath sounds.  Musculoskeletal:        General: Normal range of motion.     Cervical back: Normal range of motion.  Skin:    General: Skin is warm and dry.  Neurological:     Mental Status: He is alert and oriented to person, place, and time.  Psychiatric:        Mood and Affect: Mood normal.    BP 131/88 (BP Location: Left Arm, Patient Position: Sitting, Cuff Size: Large)   Pulse 70   Temp (!) 97.5 F (36.4 C) (Temporal)   Ht 5\' 9"  (1.753 m)   Wt 244 lb 2 oz (110.7 kg)   SpO2 99%   BMI 36.05 kg/m  Wt Readings from Last 3 Encounters:  08/12/22 244 lb 2 oz (110.7 kg)  01/24/22 267 lb 4 oz (121.2 kg)  12/27/21 261 lb 12.8 oz (118.8 kg)      Dulce Sellar, NP

## 2022-08-12 NOTE — Assessment & Plan Note (Addendum)
chronic reports taking Lipitor in past but caused leg pain LDL 146 last visit - started Crestor 3d/week pt tolerated and started taking qod rechecking lipids today, pt has lost 20lbs & changed his diet f/u in 6 mos - 45yr

## 2022-08-20 ENCOUNTER — Other Ambulatory Visit: Payer: Self-pay | Admitting: Family

## 2022-08-20 DIAGNOSIS — R7303 Prediabetes: Secondary | ICD-10-CM

## 2022-08-20 DIAGNOSIS — E785 Hyperlipidemia, unspecified: Secondary | ICD-10-CM
